# Patient Record
Sex: Male | Born: 1965 | Race: White | Hispanic: No | Marital: Married
Health system: Midwestern US, Community
[De-identification: ages and names within clinical notes are randomized; demographics above are authoritative.]

## PROBLEM LIST (undated history)

## (undated) DIAGNOSIS — F32A Depression, unspecified: Secondary | ICD-10-CM

## (undated) DIAGNOSIS — I10 Essential (primary) hypertension: Secondary | ICD-10-CM

## (undated) DIAGNOSIS — F329 Major depressive disorder, single episode, unspecified: Secondary | ICD-10-CM

## (undated) DIAGNOSIS — F419 Anxiety disorder, unspecified: Secondary | ICD-10-CM

## (undated) HISTORY — PX: SPINAL CORD STIMULATOR IMPLANT: SHX2422

## (undated) HISTORY — PX: HAND SURGERY: SHX662

## (undated) HISTORY — PX: CHOLECYSTECTOMY: SHX55

## (undated) HISTORY — PX: BACK SURGERY: SHX140

---

## 1998-07-12 ENCOUNTER — Ambulatory Visit (HOSPITAL_COMMUNITY): Admission: RE | Admit: 1998-07-12 | Discharge: 1998-07-12 | Payer: Self-pay | Admitting: *Deleted

## 2000-10-26 ENCOUNTER — Encounter: Payer: Self-pay | Admitting: Orthopedic Surgery

## 2000-10-26 ENCOUNTER — Observation Stay (HOSPITAL_COMMUNITY): Admission: EM | Admit: 2000-10-26 | Discharge: 2000-10-26 | Payer: Self-pay | Admitting: Emergency Medicine

## 2000-10-26 ENCOUNTER — Encounter (INDEPENDENT_AMBULATORY_CARE_PROVIDER_SITE_OTHER): Payer: Self-pay | Admitting: *Deleted

## 2003-03-15 ENCOUNTER — Ambulatory Visit (HOSPITAL_COMMUNITY): Admission: RE | Admit: 2003-03-15 | Discharge: 2003-03-15 | Payer: Self-pay | Admitting: General Surgery

## 2005-02-26 ENCOUNTER — Emergency Department (HOSPITAL_COMMUNITY): Admission: EM | Admit: 2005-02-26 | Discharge: 2005-02-26 | Payer: Self-pay | Admitting: Emergency Medicine

## 2005-03-12 ENCOUNTER — Encounter: Payer: Self-pay | Admitting: Family Medicine

## 2005-03-17 ENCOUNTER — Encounter (INDEPENDENT_AMBULATORY_CARE_PROVIDER_SITE_OTHER): Payer: Self-pay | Admitting: *Deleted

## 2005-03-17 ENCOUNTER — Ambulatory Visit (HOSPITAL_COMMUNITY): Admission: RE | Admit: 2005-03-17 | Discharge: 2005-03-17 | Payer: Self-pay | Admitting: Interventional Radiology

## 2005-03-30 ENCOUNTER — Encounter: Payer: Self-pay | Admitting: Interventional Radiology

## 2006-09-23 ENCOUNTER — Ambulatory Visit: Payer: Self-pay | Admitting: Cardiology
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
## 2009-08-28 ENCOUNTER — Encounter: Admission: RE | Admit: 2009-08-28 | Discharge: 2009-08-28 | Payer: Self-pay | Admitting: Sports Medicine

## 2010-10-24 DIAGNOSIS — R55 Syncope and collapse: Secondary | ICD-10-CM
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
## 2011-01-09 NOTE — H&P (Signed)
   NAME:  Aaron Soto, Aaron Soto NO.:  [id number]   MEDICAL RECORD NO.:  [id number]                  PATIENT TYPE:   LOCATION:                                       FACILITY:   PHYSICIAN:  Dalia Heading, M.D.               DATE OF BIRTH:  1965/12/10   DATE OF ADMISSION:  03/15/2003  DATE OF DISCHARGE:                                HISTORY & PHYSICAL   CHIEF COMPLAINT:  Peptic ulcer disease, GERD, hematochezia.   HISTORY OF PRESENT ILLNESS:  The patient is a 45 year old white male who is  referred for endoscopic evaluation.  He has been having intermittent  hematochezia of unknown etiology for over a month.  He denies any  lightheadedness, weight loss, fever, constipation, diarrhea, or melena.  He  denies hemorrhoidal problems.  He has never had a colonoscopy.  He also has  a history of peptic ulcer disease and GERD.  He takes ibuprofen on occasion.  He had an EGD in the past which revealed peptic ulcer disease.  No immediate  family history of colon carcinoma is noted.   PAST MEDICAL HISTORY:  As noted above; depression.   PAST SURGICAL HISTORY:  Right index finger surgery.   CURRENT MEDICATIONS:  Prilosec, BuSpar, fluoxetine.   ALLERGIES:  No known drug allergies.   REVIEW OF SYSTEMS:  Noncontributory.   PHYSICAL EXAMINATION:  GENERAL:  The patient is a well-developed, well-  nourished white male in no acute distress.  VITAL SIGNS:  He is afebrile and vital signs are stable.  LUNGS:  Clear to auscultation with equal breath sounds bilaterally.  HEART:  Reveals a regular rate and rhythm without S3, S4, or murmurs.  ABDOMEN:  Soft, nontender, nondistended.  No hepatosplenomegaly or masses  are noted.  RECTAL:  Deferred to the procedure.   IMPRESSION:  Peptic ulcer disease, gastroesophageal reflux disease,  hematochezia.    PLAN:  The patient is scheduled for an EGD and colonoscopy on March 15, 2003.  The risks and benefits of the procedure  including bleeding and perforation  were fully explained to the patient, who gave informed consent.                                               Dalia Heading, M.D.    MAJ/MEDQ  D:  03/01/2003  T:  03/01/2003  Job:  [id number]   cc:   Donzetta Sprung  [address]  [place]  [place] [address]  Fax: [phone]

## 2011-01-09 NOTE — Consult Note (Signed)
NAMEGARRETH, BURNSWORTH                 ACCOUNT NO.:  [id number]   MEDICAL RECORD NO.:  [id number]          PATIENT TYPE:  OUT   LOCATION:  XRAY                         FACILITY:  [facility]   PHYSICIAN:  Sanjeev K. Deveshwar, M.D.DATE OF BIRTH:  1966-03-22   DATE OF CONSULTATION:  03/12/2005  DATE OF DISCHARGE:                                   CONSULTATION   ADDENDUM:  The patient requested a prescription for some pain medication.  We did give him oxycodone 5/325 mg, #30, one q.6h. p.r.n. pain.  It should  be noted that greater than 40 minutes was spent on this consultation today.      Markus.Osmond   DR/MEDQ  D:  03/12/2005  T:  03/12/2005  Job:  [id number]

## 2011-01-09 NOTE — Op Note (Signed)
[place] [place] [facility]  Patient:    Aaron Soto, Aaron Soto                        MRN: [id number] Proc. Date: 10/26/00 Adm. Date:  [id number] Disc. 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  OPERATION PERFORMED: 1. Excision of residual nail matrix, right index finger. 2. Revision amputation and V-Y advancement flap coverage of proximal epiphysis    of right index distal phalanx.  SURGEON:  Katy Fitch. Sypher, Montez Hageman., M.D.  ASSISTANT:  Jonni Sanger, P.A.  ANESTHESIA:  0.25% Marcaine and 1% lidocaine metacarpal head level block of right index finger supplemented by IV sedation.  SUPERVISING ANESTHESIOLOGIST:  Dr. Krista Blue.  INDICATIONS FOR PROCEDURE:  The patient is a 45 year old right-handed man who sustained an avulsion amputation of his right index fingertip when he accidentally hung his shoe and hand on a chain link fence he was jumping over to retrieve a socker ball while watching his daughters soccer scrimmage.  He did not realize that his finger was avulsed until he looked down to get the ball to throw back and saw his bloody finger stump.  He was seen at [facility] where the emergency room physician determined that hand consult was indicated. 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  After informed consent, he is brought to the operating room at this time for revision of his right index finger and formal nail resection.  Preoperatively, we advised him  that we would try to preserve the base of the distal phalanx to preserve the insertion of his distal extensor tendon as well as his profundus tendon.  He understood that we may need to perform a V-Y advancement flap to provide coverage of the distal phalanx.  He understands from past experience with a V-Y advancement flap of the long finger that he may have altered sensibility and impaired tactile sense in the fingertip for a period of timme following flap coverage.  DESCRIPTION OF PROCEDURE:  Eryck Negron was brought to the operating room and placed in supine position on the operating table.  Following placement of a Marcaine and lidocaine metacarpal head level block, the right arm was prepped with Betadine soap and solution and sterilely draped.  When anesthesia was satisfactory, the finger was exsanguinated with a gauze wrap and a half-inch Penrose drain was placed at the base of the finger as a digital tourniquet.  The wound was meticulously debrided with sharp excision of the wound margins and debridement of the soiled distal phalangeal proximal metaphysis.  The remaining proximal epiphysis was rounded to a flat dome shape.  Care was taken to preserve the profundus insertion, the distal extensor slip insertion and the collateral ligament insertions.  The wound was then irrigated and closed by a V-Y advancement flap.  It was advanced approximately 8 mm to cover the exposed distal phalanx.  Care was taken to preserve the vascular and  neural connections to the flap.  The flap was inset with multiple interrupted sutures of 5-0 nylon.  Prior to completion of inset of the flap, remnants of the nail matrix were removed from the periosteum of the distal phalanx and the dorsal nail fold.  Care was taken to fully debride all visible nail matrix.  The wound was then irrigated a final time and closed dorsally with mattress sutures of 5-0 nylon.  Satisfactory full thickness skin coverage was  obtained with subcutaneous fat padding the distal phalanx.  There were no apparent complications.  The tourniquet was released with immediate capillary refill to the fingertip and good blush in flap.  Mr. Spinello was placed in a compressive dressing with Xeroflo, sterile gauze and Coban.  He was instructed to elevate his hand for 72 hours, 24/7 followed by return to our office for dressing change in five days.  He was given prescriptions for Keflex 500 mg 1 p.o. q.8h. x 4 days as prophylactic antibiotic, Percocet 5 mg 1 or 2 p.o. q.4-6h. p.r.n. 30 tablets without refill and Motrin 600 mg 1 p.o. q.6h. p.r.n. pain, 20 tablets with one refill. DD:  10/26/00 TD:  10/27/00 Job: [id number]

## 2015-10-17 ENCOUNTER — Other Ambulatory Visit: Payer: Self-pay | Admitting: Rheumatology

## 2015-10-17 DIAGNOSIS — M7989 Other specified soft tissue disorders: Secondary | ICD-10-CM

## 2015-10-17 DIAGNOSIS — M79642 Pain in left hand: Secondary | ICD-10-CM

## 2015-10-21 ENCOUNTER — Other Ambulatory Visit: Payer: Self-pay

## 2015-10-23 ENCOUNTER — Ambulatory Visit
Admission: RE | Admit: 2015-10-23 | Discharge: 2015-10-23 | Disposition: A | Discharge status: Home / Self Care | Payer: BLUE CROSS/BLUE SHIELD | Source: Ambulatory Visit | Attending: Rheumatology | Admitting: Rheumatology

## 2015-10-23 DIAGNOSIS — M7989 Other specified soft tissue disorders: Secondary | ICD-10-CM

## 2015-10-23 DIAGNOSIS — M79642 Pain in left hand: Secondary | ICD-10-CM

## 2016-10-04 ENCOUNTER — Other Ambulatory Visit: Payer: Self-pay

## 2016-10-04 ENCOUNTER — Emergency Department (HOSPITAL_COMMUNITY)
Admission: EM | Admit: 2016-10-04 | Discharge: 2016-10-04 | Disposition: A | Discharge status: Home / Self Care | Payer: BLUE CROSS/BLUE SHIELD | Attending: Emergency Medicine | Admitting: Emergency Medicine

## 2016-10-04 ENCOUNTER — Encounter (HOSPITAL_COMMUNITY): Payer: Self-pay | Admitting: Emergency Medicine

## 2016-10-04 DIAGNOSIS — R55 Syncope and collapse: Secondary | ICD-10-CM

## 2016-10-04 HISTORY — DX: Anxiety disorder, unspecified: F41.9

## 2016-10-04 HISTORY — DX: Essential (primary) hypertension: I10

## 2016-10-04 HISTORY — DX: Depression, unspecified: F32.A

## 2016-10-04 HISTORY — DX: Major depressive disorder, single episode, unspecified: F32.9

## 2016-10-04 LAB — CBG MONITORING, ED: GLUCOSE-CAPILLARY: 114 mg/dL — AB (ref 65–99)

## 2016-10-04 LAB — I-STAT CHEM 8, ED
BUN: 18 mg/dL (ref 6–20)
Calcium, Ion: 1.05 mmol/L — ABNORMAL LOW (ref 1.15–1.40)
Chloride: 103 mmol/L (ref 101–111)
Creatinine, Ser: 1.2 mg/dL (ref 0.61–1.24)
GLUCOSE: 119 mg/dL — AB (ref 65–99)
HEMATOCRIT: 45 % (ref 39.0–52.0)
HEMOGLOBIN: 15.3 g/dL (ref 13.0–17.0)
POTASSIUM: 3.5 mmol/L (ref 3.5–5.1)
Sodium: 136 mmol/L (ref 135–145)
TCO2: 23 mmol/L (ref 0–100)

## 2016-10-04 MED ORDER — SODIUM CHLORIDE 0.9 % IV BOLUS (SEPSIS)
1000.0000 mL | Freq: Once | INTRAVENOUS | Status: AC
  Administered 2016-10-04: 1000 mL via INTRAVENOUS

## 2016-10-04 NOTE — ED Notes (Signed)
Pt given sprite to drink. 
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
## 2016-10-04 NOTE — ED Notes (Signed)
MD at bedside. 
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
## 2016-10-04 NOTE — Discharge Instructions (Signed)
If you feel lightheaded or get sweaty, lie on the ground with your legs in the air to prevent passing out or falling Return for worsening symptoms

## 2018-12-03 ENCOUNTER — Inpatient Hospital Stay (HOSPITAL_COMMUNITY)
Admission: EM | Admit: 2018-12-03 | Discharge: 2018-12-06 | DRG: 066 | Disposition: A | Discharge status: Home / Self Care | Payer: Self-pay | Attending: Neurosurgery | Admitting: Neurosurgery

## 2018-12-03 ENCOUNTER — Emergency Department (HOSPITAL_COMMUNITY): Payer: Self-pay

## 2018-12-03 ENCOUNTER — Other Ambulatory Visit: Payer: Self-pay

## 2018-12-03 ENCOUNTER — Encounter (HOSPITAL_COMMUNITY): Payer: Self-pay | Admitting: Emergency Medicine

## 2018-12-03 DIAGNOSIS — R29701 NIHSS score 1: Secondary | ICD-10-CM | POA: Diagnosis present

## 2018-12-03 DIAGNOSIS — I609 Nontraumatic subarachnoid hemorrhage, unspecified: Principal | ICD-10-CM | POA: Diagnosis present

## 2018-12-03 DIAGNOSIS — Z79899 Other long term (current) drug therapy: Secondary | ICD-10-CM

## 2018-12-03 DIAGNOSIS — I10 Essential (primary) hypertension: Secondary | ICD-10-CM | POA: Diagnosis present

## 2018-12-03 DIAGNOSIS — R402242 Coma scale, best verbal response, confused conversation, at arrival to emergency department: Secondary | ICD-10-CM | POA: Diagnosis present

## 2018-12-03 DIAGNOSIS — R402142 Coma scale, eyes open, spontaneous, at arrival to emergency department: Secondary | ICD-10-CM | POA: Diagnosis present

## 2018-12-03 DIAGNOSIS — Z791 Long term (current) use of non-steroidal anti-inflammatories (NSAID): Secondary | ICD-10-CM

## 2018-12-03 DIAGNOSIS — G8929 Other chronic pain: Secondary | ICD-10-CM | POA: Diagnosis present

## 2018-12-03 DIAGNOSIS — Z9049 Acquired absence of other specified parts of digestive tract: Secondary | ICD-10-CM

## 2018-12-03 DIAGNOSIS — Q278 Other specified congenital malformations of peripheral vascular system: Secondary | ICD-10-CM

## 2018-12-03 DIAGNOSIS — R402362 Coma scale, best motor response, obeys commands, at arrival to emergency department: Secondary | ICD-10-CM | POA: Diagnosis present

## 2018-12-03 DIAGNOSIS — M25519 Pain in unspecified shoulder: Secondary | ICD-10-CM

## 2018-12-03 DIAGNOSIS — F419 Anxiety disorder, unspecified: Secondary | ICD-10-CM | POA: Diagnosis present

## 2018-12-03 DIAGNOSIS — Z87891 Personal history of nicotine dependence: Secondary | ICD-10-CM

## 2018-12-03 DIAGNOSIS — Z9682 Presence of neurostimulator: Secondary | ICD-10-CM

## 2018-12-03 DIAGNOSIS — I639 Cerebral infarction, unspecified: Secondary | ICD-10-CM

## 2018-12-03 DIAGNOSIS — M549 Dorsalgia, unspecified: Secondary | ICD-10-CM | POA: Diagnosis present

## 2018-12-03 DIAGNOSIS — F329 Major depressive disorder, single episode, unspecified: Secondary | ICD-10-CM | POA: Diagnosis present

## 2018-12-03 LAB — COMPREHENSIVE METABOLIC PANEL
ALT: 17 U/L (ref 0–44)
AST: 20 U/L (ref 15–41)
Albumin: 4.4 g/dL (ref 3.5–5.0)
Alkaline Phosphatase: 62 U/L (ref 38–126)
Anion gap: 9 (ref 5–15)
BUN: 19 mg/dL (ref 6–20)
CO2: 27 mmol/L (ref 22–32)
Calcium: 8.9 mg/dL (ref 8.9–10.3)
Chloride: 103 mmol/L (ref 98–111)
Creatinine, Ser: 1.33 mg/dL — ABNORMAL HIGH (ref 0.61–1.24)
GFR calc Af Amer: >60 mL/min (ref >60)
GFR calc non Af Amer: 60 mL/min — ABNORMAL LOW (ref >60)
Glucose, Bld: 100 mg/dL — ABNORMAL HIGH (ref 70–99)
Potassium: 4.3 mmol/L (ref 3.5–5.1)
Sodium: 139 mmol/L (ref 135–145)
Total Bilirubin: 0.6 mg/dL (ref 0.3–1.2)
Total Protein: 7.8 g/dL (ref 6.5–8.1)

## 2018-12-03 LAB — CBC WITH DIFFERENTIAL/PLATELET
Abs Immature Granulocytes: 0.05 10*3/uL (ref 0.00–0.07)
Basophils Absolute: 0 10*3/uL (ref 0.0–0.1)
Basophils Relative: 1 %
Eosinophils Absolute: 0.2 10*3/uL (ref 0.0–0.5)
Eosinophils Relative: 3 %
HCT: 48.1 % (ref 39.0–52.0)
Hemoglobin: 15.6 g/dL (ref 13.0–17.0)
Immature Granulocytes: 1 %
Lymphocytes Relative: 31 %
Lymphs Abs: 2.6 10*3/uL (ref 0.7–4.0)
MCH: 28 pg (ref 26.0–34.0)
MCHC: 32.4 g/dL (ref 30.0–36.0)
MCV: 86.4 fL (ref 80.0–100.0)
Monocytes Absolute: 0.6 10*3/uL (ref 0.1–1.0)
Monocytes Relative: 8 %
Neutro Abs: 4.9 10*3/uL (ref 1.7–7.7)
Neutrophils Relative %: 56 %
Platelets: 242 10*3/uL (ref 150–400)
RBC: 5.57 MIL/uL (ref 4.22–5.81)
RDW: 12.6 % (ref 11.5–15.5)
WBC: 8.5 10*3/uL (ref 4.0–10.5)
nRBC: 0 % (ref 0.0–0.2)

## 2018-12-03 LAB — URINALYSIS, ROUTINE W REFLEX MICROSCOPIC
Bilirubin Urine: NEGATIVE
Glucose, UA: NEGATIVE mg/dL
Hgb urine dipstick: NEGATIVE
Ketones, ur: NEGATIVE mg/dL
Leukocytes,Ua: NEGATIVE
Nitrite: NEGATIVE
Protein, ur: NEGATIVE mg/dL
Specific Gravity, Urine: 1.019 (ref 1.005–1.030)
pH: 5 (ref 5.0–8.0)

## 2018-12-03 LAB — TROPONIN I: Troponin I: <0.03 ng/mL (ref <0.03)

## 2018-12-03 LAB — RAPID URINE DRUG SCREEN, HOSP PERFORMED
Amphetamines: NOT DETECTED
Barbiturates: NOT DETECTED
Benzodiazepines: NOT DETECTED
Cocaine: NOT DETECTED
Opiates: POSITIVE — AB
Tetrahydrocannabinol: NOT DETECTED

## 2018-12-03 LAB — MRSA PCR SCREENING: MRSA by PCR: NEGATIVE

## 2018-12-03 LAB — CBG MONITORING, ED: Glucose-Capillary: 85 mg/dL (ref 70–99)

## 2018-12-03 LAB — ETHANOL: Alcohol, Ethyl (B): <10 mg/dL (ref <10)

## 2018-12-03 MED ORDER — PANTOPRAZOLE SODIUM 40 MG PO TBEC: 40.0000 mg | DELAYED_RELEASE_TABLET | Freq: Every day | ORAL | Status: DC

## 2018-12-03 MED ORDER — MORPHINE SULFATE (PF) 4 MG/ML IV SOLN
4.0000 mg | Freq: Once | INTRAVENOUS | Status: AC
  Administered 2018-12-03: 4 mg via INTRAVENOUS

## 2018-12-03 MED ORDER — ACETAMINOPHEN-CODEINE #3 300-30 MG PO TABS
1.0000 | ORAL_TABLET | ORAL | Status: DC | PRN
  Administered 2018-12-06: 1 via ORAL
  Filled 2018-12-03: qty 2

## 2018-12-03 MED ORDER — MORPHINE SULFATE (PF) 4 MG/ML IV SOLN
INTRAVENOUS | Status: AC
  Filled 2018-12-03: qty 1

## 2018-12-03 MED ORDER — SODIUM CHLORIDE 0.9 % IV SOLN
INTRAVENOUS | Status: DC
  Administered 2018-12-03 – 2018-12-06 (×5): via INTRAVENOUS

## 2018-12-03 MED ORDER — CYCLOBENZAPRINE HCL 10 MG PO TABS
10.0000 mg | ORAL_TABLET | Freq: Every day | ORAL | Status: DC
  Administered 2018-12-04 – 2018-12-06 (×3): 10 mg via ORAL
  Filled 2018-12-03 (×3): qty 1

## 2018-12-03 MED ORDER — MORPHINE SULFATE (PF) 2 MG/ML IV SOLN
1.0000 mg | INTRAVENOUS | Status: DC | PRN
  Administered 2018-12-03: 16:00:00 2 mg via INTRAVENOUS
  Filled 2018-12-03: qty 1

## 2018-12-03 MED ORDER — OXYCODONE HCL 5 MG PO TABS: 5.0000 mg | ORAL_TABLET | ORAL | Status: DC | PRN

## 2018-12-03 MED ORDER — DOCUSATE SODIUM 100 MG PO CAPS
100.0000 mg | ORAL_CAPSULE | Freq: Two times a day (BID) | ORAL | Status: DC
  Administered 2018-12-03 – 2018-12-06 (×6): 100 mg via ORAL
  Filled 2018-12-03 (×6): qty 1

## 2018-12-03 MED ORDER — CLEVIDIPINE BUTYRATE 0.5 MG/ML IV EMUL
0.0000 mg/h | INTRAVENOUS | Status: DC
  Administered 2018-12-03: 16:00:00 1 mg/h via INTRAVENOUS
  Filled 2018-12-03 (×2): qty 50

## 2018-12-03 MED ORDER — ONDANSETRON HCL 4 MG/2ML IJ SOLN: 4.0000 mg | Freq: Four times a day (QID) | INTRAMUSCULAR | Status: DC | PRN

## 2018-12-03 MED ORDER — SODIUM CHLORIDE 0.9 % IV SOLN
INTRAVENOUS | Status: DC
  Administered 2018-12-03: 13:00:00 via INTRAVENOUS

## 2018-12-03 MED ORDER — CYANOCOBALAMIN 500 MCG PO TABS
500.0000 ug | ORAL_TABLET | Freq: Every day | ORAL | Status: DC
  Administered 2018-12-03 – 2018-12-06 (×3): 500 ug via ORAL
  Filled 2018-12-03 (×4): qty 1

## 2018-12-03 MED ORDER — MORPHINE SULFATE (PF) 4 MG/ML IV SOLN: 4.0000 mg | Freq: Once | INTRAVENOUS | Status: DC

## 2018-12-03 MED ORDER — LABETALOL HCL 5 MG/ML IV SOLN
20.0000 mg | Freq: Once | INTRAVENOUS | Status: DC
  Filled 2018-12-03: qty 4

## 2018-12-03 MED ORDER — STROKE: EARLY STAGES OF RECOVERY BOOK
Freq: Once | Status: AC
  Administered 2018-12-03: 19:00:00

## 2018-12-03 MED ORDER — PANTOPRAZOLE SODIUM 40 MG PO PACK
40.0000 mg | PACK | Freq: Every day | ORAL | Status: DC
  Filled 2018-12-03 (×4): qty 20

## 2018-12-03 MED ORDER — ACETAMINOPHEN 160 MG/5ML PO SOLN: 650.0000 mg | ORAL | Status: DC | PRN

## 2018-12-03 MED ORDER — IOHEXOL 350 MG/ML SOLN
75.0000 mL | Freq: Once | INTRAVENOUS | Status: AC | PRN
  Administered 2018-12-03: 75 mL via INTRAVENOUS

## 2018-12-03 MED ORDER — BUSPIRONE HCL 10 MG PO TABS
20.0000 mg | ORAL_TABLET | Freq: Three times a day (TID) | ORAL | Status: DC
  Administered 2018-12-03 – 2018-12-06 (×8): 20 mg via ORAL
  Filled 2018-12-03 (×9): qty 2

## 2018-12-03 MED ORDER — ONDANSETRON 4 MG PO TBDP: 4.0000 mg | ORAL_TABLET | Freq: Four times a day (QID) | ORAL | Status: DC | PRN

## 2018-12-03 MED ORDER — ONDANSETRON HCL 4 MG/2ML IJ SOLN
4.0000 mg | Freq: Once | INTRAMUSCULAR | Status: AC
  Administered 2018-12-03: 14:00:00 4 mg via INTRAVENOUS

## 2018-12-03 MED ORDER — CLEVIDIPINE BUTYRATE 0.5 MG/ML IV EMUL: 0.0000 mg/h | INTRAVENOUS | Status: DC

## 2018-12-03 MED ORDER — HYDROCODONE-ACETAMINOPHEN 5-325 MG PO TABS
1.0000 | ORAL_TABLET | ORAL | Status: DC | PRN
  Administered 2018-12-03 (×2): 1 via ORAL
  Administered 2018-12-04 (×3): 2 via ORAL
  Administered 2018-12-05 (×2): 1 via ORAL
  Administered 2018-12-06 (×2): 2 via ORAL
  Filled 2018-12-03 (×2): qty 1
  Filled 2018-12-03 (×2): qty 2
  Filled 2018-12-03 (×2): qty 1
  Filled 2018-12-03 (×3): qty 2

## 2018-12-03 MED ORDER — ACETAMINOPHEN 650 MG RE SUPP: 650.0000 mg | RECTAL | Status: DC | PRN

## 2018-12-03 MED ORDER — ONDANSETRON HCL 4 MG/2ML IJ SOLN
INTRAMUSCULAR | Status: AC
  Administered 2018-12-03: 4 mg via INTRAVENOUS
  Filled 2018-12-03: qty 2

## 2018-12-03 MED ORDER — LEVETIRACETAM IN NACL 500 MG/100ML IV SOLN
500.0000 mg | Freq: Two times a day (BID) | INTRAVENOUS | Status: DC
  Administered 2018-12-03 – 2018-12-06 (×6): 500 mg via INTRAVENOUS
  Filled 2018-12-03 (×10): qty 100

## 2018-12-03 MED ORDER — ACETAMINOPHEN 325 MG PO TABS
650.0000 mg | ORAL_TABLET | ORAL | Status: DC | PRN
  Administered 2018-12-05 – 2018-12-06 (×3): 650 mg via ORAL
  Filled 2018-12-03 (×3): qty 2

## 2018-12-03 MED ORDER — DULOXETINE HCL 60 MG PO CPEP
60.0000 mg | ORAL_CAPSULE | Freq: Every day | ORAL | Status: DC
  Administered 2018-12-03 – 2018-12-06 (×4): 60 mg via ORAL
  Filled 2018-12-03 (×4): qty 1

## 2018-12-03 MED ORDER — PANTOPRAZOLE SODIUM 40 MG PO TBEC
40.0000 mg | DELAYED_RELEASE_TABLET | Freq: Every day | ORAL | Status: DC
  Administered 2018-12-03 – 2018-12-06 (×4): 40 mg via ORAL
  Filled 2018-12-03 (×4): qty 1

## 2018-12-03 MED ORDER — LISINOPRIL 10 MG PO TABS
10.0000 mg | ORAL_TABLET | Freq: Every day | ORAL | Status: DC
  Administered 2018-12-03 – 2018-12-06 (×4): 10 mg via ORAL
  Filled 2018-12-03 (×4): qty 1

## 2018-12-03 NOTE — ED Triage Notes (Signed)
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  Unable to answer questions. EDP at bedside  Pt is mildly confused at this time. Central chest pain at this time. VSS

## 2018-12-03 NOTE — ED Notes (Signed)
Pt unable to provide urine sample at this time 
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
## 2018-12-03 NOTE — ED Notes (Signed)
Labetalol not given per Dr. Lynelle Doctor due to Cleviprex order (written by Dr. Lynelle Doctor at [facility]) already started at 1609.

## 2018-12-03 NOTE — ED Notes (Signed)
Pt was at work and a customer went to talk to him and they realized he was confused and staring off into space. The customer went to his boss and reported it. Pt's boss brought him into the ED. Pt is alert to self and place, disoriented to time and situation. When pt is asked what year it is he replies, "2020", but when asked what month he replies, "August".   Pt continues to repeat himself, over and over, saying "What happened? Why am I here?". Pt reassured what is going on and then about 10 seconds later, pt repeats the same question with confusion. Pt is normally alert and oriented x4. Pt's LKW was at 1130 while at work per his boss.

## 2018-12-03 NOTE — Progress Notes (Addendum)
  NEUROSURGERY PROGRESS NOTE   Received call from Dr Lynelle Doctor regarding patient.  53 year old male who was found at work confused by a Financial trader. By report is confused with repetitive questioning.   CT head shows a small amount of hyperdense area within the right parietal lobe, likely acute SAH. CTA shows ?AVM vs venous angioma.   Will need to undergo diagnostic angiogram for further evaluation. Will admit to Neuro ICU for monitoring. Full note to follow.  Please call when patient arrives to [facility].   Cindra Presume, PA-C [facility]

## 2018-12-03 NOTE — ED Provider Notes (Signed)
[facility] EMERGENCY DEPARTMENT Provider Note   CSN: [id number] Arrival date & time: 12/03/18  1231    History   Chief Complaint Chief Complaint  Patient presents with  . Altered Mental Status    HPI Aaron Soto is a 53 y.o. male.     HPI The patient presents to the emergency room for evaluation of altered mental status.  The patient was at work today.  He was apparently helping a customer with some pine needles when the customer noticed that the patient was very confused.  They got the patient's manager who then brought him to the emergency room.  There is no witnessed trauma.  According to the patient's wife was doing fine before he went to work today.  Patient himself does not know what happened.  He does not know why he is here.  He keeps asking the same question about where his wife is what happened.  He does complain of some pain in the back of his head.  He also complains of some pain in the anterior part of his chest.  Pain is sharp.  He denies any fevers or chills.  No vomiting or diarrhea.  No numbness or weakness. Past Medical History:  Diagnosis Date  . Anxiety   . Depression   . Hypertension     There are no active problems to display for this patient.   Past Surgical History:  Procedure Laterality Date  . BACK SURGERY    . CHOLECYSTECTOMY    . HAND SURGERY    . SPINAL CORD STIMULATOR IMPLANT          Home Medications    Prior to Admission medications   Medication Sig Start Date End Date Taking? Authorizing Provider  busPIRone (BUSPAR) 10 MG tablet Take 20 mg by mouth 3 (three) times daily.    Yes [provider]  cyclobenzaprine (FLEXERIL) 10 MG tablet Take 10 mg by mouth daily.    Yes [provider]  DULoxetine (CYMBALTA) 60 MG capsule Take 60 mg by mouth daily.    Yes [provider]  ibuprofen (ADVIL,MOTRIN) 200 MG tablet Take 200 mg by mouth every 6 (six) hours as needed for mild pain.   Yes [provider]   ketoprofen (ORUDIS) 75 MG capsule Take 75 mg by mouth every 8 (eight) hours as needed for mild pain.    Yes [provider]  lisinopril (PRINIVIL,ZESTRIL) 10 MG tablet Take 10 mg by mouth daily.    Yes [provider]  Multiple Vitamin (MULTIVITAMIN) tablet Take 1 tablet by mouth daily.   Yes [provider]  omeprazole (PRILOSEC) 20 MG capsule Take 20 mg by mouth daily.   Yes [provider]  OVER THE COUNTER MEDICATION Take by mouth See admin instructions. CBD Oil - 750 mg by mouth twice daily   Yes [provider]  vitamin B-12 (CYANOCOBALAMIN) 500 MCG tablet Take 500 mcg by mouth daily.   Yes [provider]    Family History History reviewed. No pertinent family history.  Social History Social History   Tobacco Use  . Smoking status: Former Smoker    Last attempt to quit: 08/24/1986    Years since quitting: 32.2  . Smokeless tobacco: Never Used  Substance Use Topics  . Alcohol use: No  . Drug use: No     Allergies   Patient has no allergy information on record.   Review of Systems Review of Systems  All other systems  reviewed and are negative.    Physical Exam Updated Vital Signs BP (!) 143/92   Pulse 92   Resp 15   SpO2 99%   Physical Exam Vitals signs and nursing note reviewed.  Constitutional:      General: He is not in acute distress.    Appearance: He is well-developed.  HENT:     Head: Normocephalic and atraumatic.     Right Ear: External ear normal.     Left Ear: External ear normal.  Eyes:     General: No scleral icterus.       Right eye: No discharge.        Left eye: No discharge.     Conjunctiva/sclera: Conjunctivae normal.  Neck:     Musculoskeletal: Neck supple.     Trachea: No tracheal deviation.  Cardiovascular:     Rate and Rhythm: Normal rate and regular rhythm.  Pulmonary:     Effort: Pulmonary effort is normal. No respiratory distress.     Breath sounds: Normal breath sounds. No  stridor. No wheezing or rales.  Abdominal:     General: Bowel sounds are normal. There is no distension.     Palpations: Abdomen is soft.     Tenderness: There is no abdominal tenderness. There is no guarding or rebound.  Musculoskeletal:        General: No tenderness.  Skin:    General: Skin is warm and dry.     Findings: No rash.  Neurological:     Mental Status: He is alert.     GCS: GCS eye subscore is 4. GCS verbal subscore is 4. GCS motor subscore is 6.     Cranial Nerves: No cranial nerve deficit (no facial droop, extraocular movements intact, no slurred speech).     Sensory: No sensory deficit.     Motor: No abnormal muscle tone or seizure activity.     Coordination: Coordination normal.     Comments: Patient knows the year is 2020, he thinks the it is August, he is able to tell me where he lives, he does not know who the president is      ED Treatments / Results  Labs (all labs ordered are listed, but only abnormal results are displayed) Labs Reviewed  COMPREHENSIVE METABOLIC PANEL - Abnormal; Notable for the following components:      Result Value   Glucose, Bld 100 (*)    Creatinine, Ser 1.33 (*)    GFR calc non Af Amer 60 (*)    All other components within normal limits  CBC WITH DIFFERENTIAL/PLATELET  ETHANOL  TROPONIN I  URINALYSIS, ROUTINE W REFLEX MICROSCOPIC  RAPID URINE DRUG SCREEN, HOSP PERFORMED  CBG MONITORING, ED  CBG MONITORING, ED    EKG EKG Interpretation  Date/Time:  Saturday December 03 2018 12:38:22 EDT Ventricular Rate:  97 PR Interval:    QRS Duration: 91 QT Interval:  344 QTC Calculation: 437 R Axis:   69 Text Interpretation:  Sinus rhythm Low voltage, precordial leads No significant change since last tracing Confirmed by Linwood Dibbles [phone]) on 12/03/2018 12:52:25 PM   Radiology Ct Head Wo Contrast  Result Date: 12/03/2018 CLINICAL DATA:  Confusion, chest pain. EXAM: CT HEAD WITHOUT CONTRAST TECHNIQUE: Contiguous axial images were  obtained from the base of the skull through the vertex without intravenous contrast. COMPARISON:  None. FINDINGS: Brain: Small hyperdense focus adjacent to the posterior margin of the RIGHT sylvian fissure, most likely small focus of acute subarachnoid hemorrhage within  the sulcus, alternatively chronic calcification. There is no mass, hemorrhage, edema or other evidence of acute parenchymal abnormality. No mass effect or midline shift. Vascular: No hyperdense vessel or unexpected calcification. Skull: Normal. Negative for fracture or focal lesion. Sinuses/Orbits: No acute finding. Other: None. IMPRESSION: 1. Small hyperdense focus within the RIGHT parietal lobe, most likely small acute subarachnoid hemorrhage, possibly sequela of venous angioma or other vascular malformation in the absence of trauma. As patient has a spinal cord stimulator in place, recommend CT angiogram of the head for further characterization. 2. Remainder of the exam is unremarkable, as detailed above. Critical Value/emergent results were called by telephone at the time of interpretation on 12/03/2018 at 2:20 pm to Dr. Linwood DibblesJON Prisila Dlouhy , who verbally acknowledged these results. Electronically Signed   By: Bary RichardStan  Maynard M.D.   On: 12/03/2018 14:22   Dg Chest Port 1 View  Result Date: 12/03/2018 CLINICAL DATA:  Altered level of consciousness EXAM: PORTABLE CHEST 1 VIEW COMPARISON:  Chest x-ray dated 03/17/2010. FINDINGS: Heart size and mediastinal contours are within normal limits given the supine patient positioning. Lungs are clear. No pleural effusion or pneumothorax seen. Stimulator device positioned within the midline of the lower thoracic spine. Additional catheter extending from the neck to the RIGHT upper abdomen, of uncertain etiology. IMPRESSION: No active disease.  No evidence of pneumonia or pulmonary edema. Electronically Signed   By: Bary RichardStan  Maynard M.D.   On: 12/03/2018 14:12    Procedures .Critical Care Performed by: Linwood DibblesKnapp, Lark Langenfeld, MD  Authorized by: Linwood DibblesKnapp, Elizebath Wever, MD   Critical care provider statement:    Critical care time (minutes):  30   Critical care was time spent personally by me on the following activities:  Discussions with consultants, evaluation of patient's response to treatment, examination of patient, ordering and performing treatments and interventions, ordering and review of laboratory studies, ordering and review of radiographic studies, pulse oximetry, re-evaluation of patient's condition, obtaining history from patient or surrogate and review of old charts   (including critical care time)  Medications Ordered in ED Medications  0.9 %  sodium chloride infusion ( Intravenous New Bag/Given 12/03/18 1258)  morphine 4 MG/ML injection (has no administration in time range)  morphine 4 MG/ML injection 4 mg (4 mg Intravenous Given 12/03/18 1359)  ondansetron (ZOFRAN) injection 4 mg (4 mg Intravenous Given 12/03/18 1403)     Initial Impression / Assessment and Plan / ED Course  I have reviewed the triage vital signs and the nursing notes.  Pertinent labs & imaging results that were available during my care of the patient were reviewed by me and considered in my medical decision making (see chart for details).  Clinical Course as of Dec 02 1605  Sat Dec 03, 2018  1255 Patient's mental status is suggestive of either a concussion or possibly a postictal state.  There is no reported trauma or seizure activity.   [JK]  1356 Now complaining of more significant pain in the anterior chest and neck   [JK]  1432 Verbal radiology report shows a small subarachnoid hemorrhage.   [JK]  1433 This certainly accounts for the patient's headache and confusion.  Does remain awake and alert.  GCS 14.  I discussed the findings with the patient and his wife.  [facility] and arrange for transfer and admission   [JK]  1455 D/w Vinny, neurosurgery.  Would like to CT angio first to determine who/where  to admit.  CT angio already  ordered.   [JK]  1517 Discussed with neurosurg.   No surgical intervention needed at this time.  Requests neurology vs medicine admit   [JK]  1518 BP elevated at 143/92.  Will start cleviprex at low dose.   [JK]  1534 D/w Dr Chestine Spore.  Could be a small AVM.     [JK]  1550 D/w Aroor.  Would recommend critical care for admission.   [JK]  1601 D/w Dr Marchelle Gearing.  With recent covid 19 pandemic there is a  surge plan in place.  Neurosurg and neurology are to admit these patients directly.  Not critical care.   They are happy to consult if needed.   [JK]  1607 D/w PA Costella.  Neurosurg will admit directly   [JK]    Clinical Course User Index [JK] Linwood Dibbles, MD     Final Clinical Impressions(s) / ED Diagnoses   Final diagnoses:  Subarachnoid hemorrhage [facility])      Linwood Dibbles, MD 12/03/18 [phone]

## 2018-12-04 ENCOUNTER — Inpatient Hospital Stay (HOSPITAL_COMMUNITY): Payer: Self-pay

## 2018-12-04 NOTE — Evaluation (Signed)
Occupational Therapy Evaluation Patient Details Name: Aaron Soto MRN: [id number] DOB: 06/15/1966 Today's Date: 12/04/2018    History of Present Illness 53 y.o. 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 Undergoing workup; due to a spinal cord stimulator, we cannot obtain an MRI/MRA. Considering diagnistic angiogram early next week   Clinical Impression   PTA patient independent and working.  Admitted for above and limited by pain, decreased activity tolerance, decreased functional use/ROM of R UE, impaired balance, and impaired cognition (slow processing, decreased awareness and decreased STM).  Patient currently able to complete grooming with setup assistance, UB ADLs with min assist, LB ADLs with mod assist, and basic transfers (simulated toilet transfer) with min assist +2 safety.  Patient will benefit from continued OT services while admitted to maximize independence and safety with ADLs/mobility, at this time recommending HHOT services at discharge.  Will follow and update as needed.     Follow Up Recommendations  Supervision/Assistance - 24 hour;Home health OT(may progress to NO follow up needed)    Equipment Recommendations  3 in 1 bedside commode    Recommendations for Other Services       Precautions / Restrictions Precautions Precautions: Fall Precaution Comments: Fall risk relatively low, but still present      Mobility Bed Mobility Overal bed mobility: Needs Assistance Bed Mobility: Supine to Sit     Supine to sit: Min guard     General bed mobility comments: Grimace as he used the RUE to push up to sit, L UE support required with cueing for technique  Transfers Overall transfer level: Needs assistance Equipment used: 2 person hand held assist Transfers: Sit to/from Stand Sit to Stand: Min assist         General transfer comment: Min assist for balance    Balance Overall balance assessment: Needs  assistance Sitting-balance support: No upper extremity supported;Feet supported Sitting balance-Leahy Scale: Fair     Standing balance support: Single extremity supported;During functional activity Standing balance-Leahy Scale: Fair   Single Leg Stance - Right Leg: 5 Single Leg Stance - Left Leg: 5                       ADL either performed or assessed with clinical judgement   ADL Overall ADL's : Needs assistance/impaired     Grooming: Wash/dry hands;Wash/dry face;Set up;Sitting   Upper Body Bathing: Minimal assistance;Sitting   Lower Body Bathing: Moderate assistance;Sit to/from stand   Upper Body Dressing : Minimal assistance;Sitting   Lower Body Dressing: Moderate assistance;Sit to/from stand   Toilet Transfer: Minimal Cabin crewassistance;Stand-pivot Toilet Transfer Details (indicate cue type and reason): simulated to recliner          Functional mobility during ADLs: Minimal assistance General ADL Comments: pt limited by pain, decreased functional use of dominant R UE, poor act tolerance      Vision Baseline Vision/History: Wears glasses Wears Glasses: Reading only Patient Visual Report: Blurring of vision Additional Comments: appears WFL, further assessment warrented     Perception     Praxis      Pertinent Vitals/Pain Pain Assessment: Faces Faces Pain Scale: Hurts even more Pain Location: headache, R clavice/UE  Pain Descriptors / Indicators: Aching Pain Intervention(s): Monitored during session(opted to keep NWB until imaging results)     Hand Dominance Right   Extremity/Trunk Assessment Upper Extremity Assessment Upper Extremity Assessment: RUE deficits/detail RUE Deficits / Details: limited ROM due to pain, shoulder to 45 degrees;  distal to shoulder WFL ROM; generalized weakness 3+/5; 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  RUE: Unable to fully assess due to pain RUE Sensation: WNL RUE Coordination: decreased gross motor   Lower Extremity  Assessment Lower Extremity Assessment: Defer to PT evaluation       Communication Communication Communication: No difficulties   Cognition Arousal/Alertness: Awake/alert Behavior During Therapy: WFL for tasks assessed/performed Overall Cognitive Status: Impaired/Different from baseline Area of Impairment: Awareness;Memory;Following commands;Safety/judgement;Problem solving                     Memory: Decreased short-term memory Following Commands: Follows one step commands consistently;Follows one step commands with increased time Safety/Judgement: Decreased awareness of deficits;Decreased awareness of safety Awareness: Emergent Problem Solving: Slow processing;Requires verbal cues General Comments: Pt reports some slow processing and does not recall the AMS he experienced at work; able to follow commands, requires min cueing for problem solving; further functional assessment recommended with pill box test   General Comments  VSS    Exercises     Shoulder Instructions      Home Living Family/patient expects to be discharged to:: Private residence Living Arrangements: Spouse/significant other Available Help at Discharge: Family;Available 24 hours/day Type of Home: House Home Access: Stairs to enter [employer] of Steps: 6 Entrance Stairs-Rails: Right Home Layout: One level     Bathroom Shower/Tub: Tub/shower unit;Walk-in shower(uses tub shower )   Bathroom Toilet: Standard                Prior Functioning/Environment Level of Independence: Independent                 OT Problem List: Decreased strength;Decreased range of motion;Decreased activity tolerance;Impaired balance (sitting and/or standing);Decreased coordination;Decreased cognition;Decreased safety awareness;Decreased knowledge of use of DME or AE;Impaired UE functional use;Pain;Decreased knowledge of precautions      OT Treatment/Interventions: Self-care/ADL  training;Neuromuscular education;DME and/or AE instruction;Therapeutic activities;Cognitive remediation/compensation;Patient/family education;Balance training;Visual/perceptual remediation/compensation    OT Goals(Current goals can be found in the care plan section) Acute Rehab OT Goals Patient Stated Goal: Hopes to get home soon OT Goal Formulation: With patient Time For Goal Achievement: 12/18/18 Potential to Achieve Goals: Good  OT Frequency: Min 3X/week   Barriers to D/C:            Co-evaluation              AM-PAC OT "6 Clicks" Daily Activity     Outcome Measure Help from another person eating meals?: Total(NPO) Help from another person taking care of personal grooming?: A Little Help from another person toileting, which includes using toliet, bedpan, or urinal?: A Lot Help from another person bathing (including washing, rinsing, drying)?: A Lot Help from another person to put on and taking off regular upper body clothing?: A Little Help from another person to put on and taking off regular lower body clothing?: A Lot 6 Click Score: 13   End of Session Equipment Utilized During Treatment: Gait belt Nurse Communication: Mobility status  Activity Tolerance: Patient tolerated treatment well Patient left: in chair;with call bell/phone within reach;with chair alarm set  OT Visit Diagnosis: Other abnormalities of gait and mobility (R26.89);Pain;Muscle weakness (generalized) (M62.81);Other symptoms and signs involving cognitive function Pain - Right/Left: Right Pain - part of body: Arm(clavicle, headahce)                Time: 8295-6213 OT Time Calculation (min): 28 min Charges:  OT General Charges $OT Visit: 1 Visit OT Evaluation $OT Eval  Moderate Complexity: 1 Mod  Chancy Milroy, [place] [facility] Pager [phone] Office [phone]   Chancy Milroy 12/04/2018, 1:12 PM

## 2018-12-04 NOTE — Progress Notes (Signed)
  NEUROSURGERY PROGRESS NOTE   No issues overnight.  Complains of persistent bifrontal headache improved with hydrocodone. Associated with photophobia. Denies changes in vision.  Continues to endorse "foggy headedness"  EXAM:  BP 110/78   Pulse 81   Temp 98 F (36.7 C) (Oral)   Resp 11   Ht 5\' 11"  (1.803 m)   Wt 113.5 kg   SpO2 98%   BMI 34.90 kg/m   Awake, alert, oriented x4  Speech fluent, appropriate  CN grossly intact  5/5 BUE/BLE  No drift  IMPRESSION/PLAN 53 y.o. male ?nontraumatic SAH d#2 with underlying possible vascular malformation. Remains neurologically intact. - Repeat head CT. If stable or without significant change okay for normal diet.  In prep for possible angiogram tomorrow, will keep NPO at midnight. Angio ultimately will be determined by Dr Conchita Paris - Continue supportive care   Cindra Presume, [facility]

## 2018-12-04 NOTE — Evaluation (Signed)
Physical Therapy Evaluation Patient Details Name: Aaron LefevreKeith L Menard MRN: [id number] DOB: 02/25/1966 Today's Date: 12/04/2018   History of Present Illness  53 y.o. 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 Undergoing workup; due to a spinal cord stimulator, we cannot obtain an MRI/MRA. Considering diagnistic angiogram early next week  Clinical Impression   Pt admitted with above diagnosis. Pt currently with functional limitations due to the deficits listed below (see PT Problem List). Independent and working Prior to admission; presents with decr activity tolerance, decr functional mobility,  impaired balance, and impaired cognition (slow processing, decreased awareness and decreased STM). Currently needing mod assist for balance challenges;  Pt will benefit from skilled PT to increase their independence and safety with mobility to allow discharge to the venue listed below.       Follow Up Recommendations Home health PT;Supervision/Assistance - 24 hour(may progress well enough to not need HHPT)    Equipment Recommendations  Other (comment)(Perhaps cane versus RW)    Recommendations for Other Services       Precautions / Restrictions Precautions Precautions: Fall Precaution Comments: Fall risk relatively low, but still present      Mobility  Bed Mobility Overal bed mobility: Needs Assistance Bed Mobility: Supine to Sit     Supine to sit: Min guard     General bed mobility comments: Grimace as he used the RUE to push up to sit, L UE support required with cueing for technique  Transfers Overall transfer level: Needs assistance Equipment used: 2 person hand held assist Transfers: Sit to/from Stand Sit to Stand: Min assist         General transfer comment: Min assist for balance  Ambulation/Gait Ambulation/Gait assistance: Min assist Gait Distance (Feet): (March in place and pivotal steps to chair) Assistive device: 2 person hand  held assist       General Gait Details: [facility] and took pivotal steps to recliner; mildly unsteady  Information systems managertairs            Wheelchair Mobility    Modified Rankin (Stroke Patients Only) Modified Rankin (Stroke Patients Only) Pre-Morbid Rankin Score: No symptoms Modified Rankin: Moderate disability     Balance Overall balance assessment: Needs assistance Sitting-balance support: No upper extremity supported;Feet supported Sitting balance-Leahy Scale: Fair     Standing balance support: Single extremity supported;During functional activity Standing balance-Leahy Scale: Fair   Single Leg Stance - Right Leg: 5 Single Leg Stance - Left Leg: 5                         Pertinent Vitals/Pain Pain Assessment: Faces Faces Pain Scale: Hurts even more Pain Location: headache, R clavice/UE  Pain Descriptors / Indicators: Aching Pain Intervention(s): Monitored during session(opted to keep NWB until imaging results)    Home Living Family/patient expects to be discharged to:: Private residence Living Arrangements: Spouse/significant other Available Help at Discharge: Family;Available 24 hours/day Type of Home: House Home Access: Stairs to enter Entrance Stairs-Rails: Right Entrance Stairs-Number of Steps: 6 Home Layout: One level        Prior Function Level of Independence: Independent               Hand Dominance   Dominant Hand: Right    Extremity/Trunk Assessment   Upper Extremity Assessment Upper Extremity Assessment: RUE deficits/detail RUE Deficits / Details: limited ROM due to pain, shoulder to 45 degrees; distal to shoulder WFL ROM; generalized  weakness 3+/5; 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  RUE: Unable to fully assess due to pain RUE Sensation: WNL RUE Coordination: decreased gross motor    Lower Extremity Assessment Lower Extremity Assessment: Defer to PT evaluation       Communication   Communication: No difficulties   Cognition Arousal/Alertness: Awake/alert Behavior During Therapy: WFL for tasks assessed/performed Overall Cognitive Status: Impaired/Different from baseline Area of Impairment: Awareness;Memory;Following commands;Safety/judgement;Problem solving                     Memory: Decreased short-term memory Following Commands: Follows one step commands consistently;Follows one step commands with increased time Safety/Judgement: Decreased awareness of deficits;Decreased awareness of safety Awareness: Emergent Problem Solving: Slow processing;Requires verbal cues General Comments: Pt reports some slow processing and does not recall the AMS he experienced at work; able to follow commands, requires min cueing for problem solving; further functional assessment recommended with pill box test      General Comments General comments (skin integrity, edema, etc.): VSS    Exercises     Assessment/Plan    PT Assessment Patient needs continued PT services  PT Problem List Decreased strength;Decreased activity tolerance;Decreased balance;Decreased mobility;Decreased coordination;Decreased cognition;Decreased knowledge of use of DME;Decreased safety awareness;Decreased knowledge of precautions;Pain       PT Treatment Interventions DME instruction;Gait training;Stair training;Functional mobility training;Therapeutic activities;Therapeutic exercise;Balance training;Patient/family education;Neuromuscular re-education;Cognitive remediation    PT Goals (Current goals can be found in the Care Plan section)  Acute Rehab PT Goals Patient Stated Goal: Hopes to get home soon PT Goal Formulation: With patient Time For Goal Achievement: 12/18/18 Potential to Achieve Goals: Good    Frequency Min 4X/week   Barriers to discharge        Co-evaluation               AM-PAC PT "6 Clicks" Mobility  Outcome Measure Help needed turning from your back to your side while in a flat bed without using  bedrails?: None Help needed moving from lying on your back to sitting on the side of a flat bed without using bedrails?: A Little Help needed moving to and from a bed to a chair (including a wheelchair)?: A Little Help needed standing up from a chair using your arms (e.g., wheelchair or bedside chair)?: A Little Help needed to walk in hospital room?: A Little Help needed climbing 3-5 steps with a railing? : A Little 6 Click Score: 19    End of Session Equipment Utilized During Treatment: Gait belt Activity Tolerance: Patient tolerated treatment well Patient left: in chair;with call bell/phone within reach;with chair alarm set Nurse Communication: Mobility status PT Visit Diagnosis: Unsteadiness on feet (R26.81);Other abnormalities of gait and mobility (R26.89)    Time: 6599-3570 PT Time Calculation (min) (ACUTE ONLY): 28 min   Charges:   PT Evaluation $PT Eval Moderate Complexity: 1 Mod          Van Clines, [place]  Acute Rehabilitation Services Pager [phone] Office [phone]   Levi Aland 12/04/2018, 2:31 PM

## 2018-12-04 NOTE — Progress Notes (Signed)
Video call completed with wife, Aaron Soto. Pt alert and oriented. Educated on elink and video call. 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

## 2018-12-04 NOTE — Progress Notes (Signed)
PT Cancellation Note  Patient Details Name: Aaron Soto MRN: [id number] DOB: [birth year]   Cancelled Treatment:    Reason Eval/Treat Not Completed: Patient at procedure or test/unavailable   Will follow,   Van Clines, PT  Acute Rehabilitation Services Pager [phone] Office [phone]    Levi Aland 12/04/2018, 9:30 AM

## 2018-12-04 NOTE — Progress Notes (Signed)
OT Cancellation Note  Patient Details Name: Aaron Soto MRN: [id number] DOB: July 25, 1966   Cancelled Treatment:    Reason Eval/Treat Not Completed: Patient at procedure or test/ unavailable. Off unit.  Will follow.  Chancy Milroy, OT Acute Rehabilitation Services Pager [phone] Office [phone]   Chancy Milroy 12/04/2018, 10:05 AM

## 2018-12-05 ENCOUNTER — Encounter (HOSPITAL_COMMUNITY): Payer: Self-pay | Admitting: Neurosurgery

## 2018-12-05 ENCOUNTER — Inpatient Hospital Stay (HOSPITAL_COMMUNITY): Payer: Self-pay

## 2018-12-05 HISTORY — PX: IR ANGIO EXTERNAL CAROTID SEL EXT CAROTID BILAT MOD SED: IMG5372

## 2018-12-05 HISTORY — PX: IR ANGIO INTRA EXTRACRAN SEL INTERNAL CAROTID BILAT MOD SED: IMG5363

## 2018-12-05 HISTORY — PX: IR ANGIO VERTEBRAL SEL VERTEBRAL UNI R MOD SED: IMG5368

## 2018-12-05 LAB — HIV ANTIBODY (ROUTINE TESTING W REFLEX): HIV Screen 4th Generation wRfx: NONREACTIVE

## 2018-12-05 MED ORDER — MIDAZOLAM HCL 2 MG/2ML IJ SOLN
INTRAMUSCULAR | Status: AC
  Filled 2018-12-05: qty 2

## 2018-12-05 MED ORDER — MIDAZOLAM HCL 2 MG/2ML IJ SOLN
INTRAMUSCULAR | Status: AC | PRN
  Administered 2018-12-05: 1 mg via INTRAVENOUS

## 2018-12-05 MED ORDER — HEPARIN SODIUM (PORCINE) 1000 UNIT/ML IJ SOLN
INTRAMUSCULAR | Status: AC
  Filled 2018-12-05: qty 1

## 2018-12-05 MED ORDER — FENTANYL CITRATE (PF) 100 MCG/2ML IJ SOLN
INTRAMUSCULAR | Status: AC
  Filled 2018-12-05: qty 2

## 2018-12-05 MED ORDER — IOHEXOL 300 MG/ML  SOLN: 25.0000 mL | Freq: Once | INTRAMUSCULAR | Status: DC | PRN

## 2018-12-05 MED ORDER — FENTANYL CITRATE (PF) 100 MCG/2ML IJ SOLN
INTRAMUSCULAR | Status: AC | PRN
  Administered 2018-12-05: 25 ug via INTRAVENOUS

## 2018-12-05 MED ORDER — HEPARIN SODIUM (PORCINE) 1000 UNIT/ML IJ SOLN
INTRAMUSCULAR | Status: AC | PRN
  Administered 2018-12-05: 2000 [IU] via INTRAVENOUS

## 2018-12-05 MED ORDER — LIDOCAINE HCL 1 % IJ SOLN
INTRAMUSCULAR | Status: AC
  Filled 2018-12-05: qty 20

## 2018-12-05 NOTE — Progress Notes (Signed)
Physical Therapy Treatment Patient Details Name: Aaron LefevreKeith L Soto MRN: [id number] DOB: 11/20/1965 Today's Date: 12/05/2018    History of Present Illness 53 y.o. 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 Spinal cord stimulator present so cannot obtain an MRI/MRA. s/p cerebral angiogram 4/13- normal.    PT Comments    Patient progressing well towards PT goals. Improved ambulation distance today with Min guard for balance/safety. Reports headache worsens with activity and is sensitive to light. VSS throughout. Able to perform dual tasking during ambulation and noted some difficulty esp when fatigued. Reports not feeling back to baseline with regards to cognition. Will continue to follow and progress as tolerated.     Follow Up Recommendations  Home health PT;Supervision/Assistance - 24 hour     Equipment Recommendations  None recommended by PT    Recommendations for Other Services       Precautions / Restrictions Precautions Precautions: Fall Restrictions Weight Bearing Restrictions: No    Mobility  Bed Mobility Overal bed mobility: Needs Assistance Bed Mobility: Supine to Sit;Sit to Supine     Supine to sit: Min guard;HOB elevated Sit to supine: Min guard;HOB elevated   General bed mobility comments: Min guard for safety. Grimacing when pushing up/lowering down through RUE.   Transfers Overall transfer level: Needs assistance Equipment used: None Transfers: Sit to/from Stand Sit to Stand: Min guard         General transfer comment: Min guard for safety. Stood from EOB, no dizziness.   Ambulation/Gait Ambulation/Gait assistance: Min guard Gait Distance (Feet): 1000 Feet Assistive device: None Gait Pattern/deviations: Step-through pattern;Decreased stride length;Drifts right/left   Gait velocity interpretation: 1.31 - 2.62 ft/sec, indicative of limited community ambulator General Gait Details: Slow, mostly steady gait  with some deviations noted with mental tasks. Missed room during hallway navigation needing cues.    Stairs             Wheelchair Mobility    Modified Rankin (Stroke Patients Only) Modified Rankin (Stroke Patients Only) Pre-Morbid Rankin Score: No symptoms Modified Rankin: Moderate disability     Balance Overall balance assessment: Needs assistance Sitting-balance support: No upper extremity supported;Feet supported Sitting balance-Leahy Scale: Fair Sitting balance - Comments: Able to reach outside BoS and donn socks sitting EOB without LOB.   Standing balance support: During functional activity Standing balance-Leahy Scale: Fair                              Cognition Arousal/Alertness: Awake/alert Behavior During Therapy: WFL for tasks assessed/performed Overall Cognitive Status: Impaired/Different from baseline Area of Impairment: Memory;Problem solving                             Problem Solving: Slow processing General Comments: A&Ox3- difficulty getting specific date but knows it is April 2020 and Monday. Reports not feeling at baseline with thinking. Does not recall event. Able to perform dual tasking- serial counting during ambulation with some difficulty esp when fatigued.       Exercises      General Comments General comments (skin integrity, edema, etc.): VSS throughout; HA worsened with mobility and with light.       Pertinent Vitals/Pain Pain Assessment: 0-10 Pain Score: 7  Pain Location: headache, R clavice/UE  Pain Descriptors / Indicators: Headache;Sore;Tender;Discomfort Pain Intervention(s): Monitored during session;Repositioned    Home Living  Prior Function            PT Goals (current goals can now be found in the care plan section) Progress towards PT goals: Progressing toward goals    Frequency    Min 4X/week      PT Plan Current plan remains appropriate     Co-evaluation PT/OT/SLP Co-Evaluation/Treatment: Yes Reason for Co-Treatment: For patient/therapist safety;To address functional/ADL transfers PT goals addressed during session: Mobility/safety with mobility;Balance        AM-PAC PT "6 Clicks" Mobility   Outcome Measure  Help needed turning from your back to your side while in a flat bed without using bedrails?: None Help needed moving from lying on your back to sitting on the side of a flat bed without using bedrails?: A Little Help needed moving to and from a bed to a chair (including a wheelchair)?: None Help needed standing up from a chair using your arms (e.g., wheelchair or bedside chair)?: None Help needed to walk in hospital room?: A Little Help needed climbing 3-5 steps with a railing? : A Little 6 Click Score: 21    End of Session Equipment Utilized During Treatment: Gait belt Activity Tolerance: Patient tolerated treatment well Patient left: in bed;with call bell/phone within reach;with bed alarm set Nurse Communication: Mobility status PT Visit Diagnosis: Unsteadiness on feet (R26.81);Other abnormalities of gait and mobility (R26.89)     Time: 1430-1457 PT Time Calculation (min) (ACUTE ONLY): 27 min  Charges:  $Gait Training: 8-22 mins                     Mylo Red, [place], DPT Acute Rehabilitation Services Pager [phone] Office [phone]       Blake Divine A Lanier Ensign 12/05/2018, 3:46 PM

## 2018-12-05 NOTE — Progress Notes (Signed)
PT Cancellation Note  Patient Details Name: MATHAIS RUNKLES MRN: [id number] DOB: May 07, 1966   Cancelled Treatment:    Reason Eval/Treat Not Completed: Patient at procedure or test/unavailable pt off floor at IR. Will follow up as time allows.   Blake Divine A Kyliee Ortego 12/05/2018, 11:22 AM Mylo Red, PT, DPT Acute Rehabilitation Services Pager [phone] Office [phone]

## 2018-12-05 NOTE — Progress Notes (Signed)
Occupational Therapy Treatment Patient Details Name: Aaron Soto MRN: [id number] DOB: [birth year] Today's Date: 12/05/2018    History of present illness 53 y.o. 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 Spinal cord stimulator present so cannot obtain an MRI/MRA. s/p cerebral angiogram 4/13- normal.   OT comments  Pt able to perform ADLs at min guard assist level. Cognition appears to be improving, but will continue to assess higher level skills  Min guard assist for ADLs.   Follow Up Recommendations  Supervision/Assistance - 24 hour;Home health OT    Equipment Recommendations  None recommended by OT    Recommendations for Other Services       Precautions / Restrictions Precautions Precautions: Fall Precaution Comments: Fall risk relatively low, but still present Restrictions Weight Bearing Restrictions: No       Mobility Bed Mobility Overal bed mobility: Needs Assistance Bed Mobility: Supine to Sit;Sit to Supine     Supine to sit: Min guard Sit to supine: Min guard   General bed mobility comments: Min guard for safety. Grimacing when pushing up/lowering down through RUE.   Transfers Overall transfer level: Needs assistance Equipment used: None Transfers: Sit to/from [employer] Sit to Stand: Min guard Stand pivot transfers: Min guard       General transfer comment: Min guard for safety. Stood from EOB, no dizziness.     Balance Overall balance assessment: Needs assistance Sitting-balance support: No upper extremity supported;Feet supported Sitting balance-Leahy Scale: Good Sitting balance - Comments: Able to reach outside BoS and donn socks sitting EOB without LOB.   Standing balance support: During functional activity Standing balance-Leahy Scale: Fair                             ADL either performed or assessed with clinical judgement   ADL Overall ADL's : Needs  assistance/impaired         Upper Body Bathing: Set up   Lower Body Bathing: Min guard;Sit to/from stand       Lower Body Dressing: Min guard;Sit to/from stand   Toilet Transfer: Min guard;Ambulation;Comfort height toilet           Functional mobility during ADLs: Min guard       Vision   Additional Comments: appears [facility]    Perception     Praxis      Cognition Arousal/Alertness: Awake/alert Behavior During Therapy: WFL for tasks assessed/performed Overall Cognitive Status: Impaired/Different from baseline Area of Impairment: Attention;Memory;Awareness;Problem solving                   Current Attention Level: Selective;Alternating Memory: Decreased short-term memory     Awareness: Emergent Problem Solving: Slow processing General Comments: Pt able to perform serial 2s to and from 100 with no errors while ambulating.  He was able to recall events of day         Exercises     Shoulder Instructions       General Comments VSS     Pertinent Vitals/ Pain       Pain Assessment: 0-10 Pain Score: 7  Pain Location: headache, R clavice/UE  Pain Descriptors / Indicators: Headache;Sore;Tender;Discomfort Pain Intervention(s): Monitored during session  Home Living  Prior Functioning/Environment              Frequency  Min 3X/week        Progress Toward Goals  OT Goals(current goals can now be found in the care plan section)  Progress towards OT goals: Progressing toward goals     Plan Discharge plan remains appropriate;Equipment recommendations need to be updated    Co-evaluation    PT/OT/SLP Co-Evaluation/Treatment: Yes Reason for Co-Treatment: For patient/therapist safety;To address functional/ADL transfers PT goals addressed during session: Mobility/safety with mobility;Balance OT goals addressed during session: ADL's and self-care      AM-PAC OT "6 Clicks" Daily  Activity     Outcome Measure   Help from another person eating meals?: None Help from another person taking care of personal grooming?: A Little Help from another person toileting, which includes using toliet, bedpan, or urinal?: A Little Help from another person bathing (including washing, rinsing, drying)?: A Little Help from another person to put on and taking off regular upper body clothing?: A Little Help from another person to put on and taking off regular lower body clothing?: A Little 6 Click Score: 19    End of Session Equipment Utilized During Treatment: Gait belt  OT Visit Diagnosis: Other abnormalities of gait and mobility (R26.89);Pain;Muscle weakness (generalized) (M62.81);Other symptoms and signs involving cognitive function Pain - Right/Left: Right Pain - part of body: Arm   Activity Tolerance Patient tolerated treatment well   Patient Left in bed;with call bell/phone within reach   Nurse Communication Mobility status        Time: 1430-1457 OT Time Calculation (min): 27 min  Charges: OT General Charges $OT Visit: 1 Visit OT Treatments $Therapeutic Activity: 8-22 mins  Jeani HawkingWendi Andreal [place], OTR/L Acute Rehabilitation Services Pager [phone] Office [phone]    Jeani HawkingConarpe, Juliane Guest M 12/05/2018, 5:03 PM

## 2018-12-05 NOTE — Procedures (Signed)
SURGEON: Dr. Lisbeth Renshaw, MD  ANESTHESIA: IV Sedation with Local  EBL: Minimal  SPECIMENS: None  COMPLICATIONS: None  CONDITION: Stable to ICU  FINDINGS (Full report in [facility]): 1. Normal diagnostic angiogram, no aneurysms, AVM, or fistulas identified.

## 2018-12-05 NOTE — Progress Notes (Signed)
  NEUROSURGERY PROGRESS NOTE   History reviewed, no issues overnight. Briefly, pt initially experienced confusion at work, unable to remember exactly what happened. Developed HA subsequent to the confusion. Denies associated visual change, N/T/W. Hx of controlled HTN, no smoking hx, no family hx of hemorrhage.  EXAM:  BP 129/85   Pulse 72   Temp 98.2 F (36.8 C) (Oral)   Resp 13   Ht 5\' 11"  (1.803 m)   Wt 113.5 kg   SpO2 94%   BMI 34.90 kg/m   Awake, alert, oriented  Speech fluent, appropriate  CN grossly intact  5/5 BUE/BLE   IMAGING: CTH demonstrates small punctate focus of hemorrhage in the posterior right sylvian fissure. CTA demonstrates ?serpiginous vessels in the sam region although this is quite subtle. No obvious aneurysms or AVM nidus is seen. I also do not see any large draining vein or venous varix although the right basal vein of Rosenthal and the right transverse-sigmoid sinus opacifies more prominently than the left.  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  PLAN: - Will proceed with diagnostic angiogram today for further w/u.  I have reviewed the imaging findings thus far with the patient. Details of the procedure, indications, and risks to include stroke, arterial injury, hematoma, nephropathy, allergic reaction were all reviewed. All his questions today were answered and consent was obtained and witnessed.

## 2018-12-06 NOTE — Discharge Summary (Signed)
Physician Discharge Summary  Patient ID: Aaron Soto MRN: [id number] DOB/AGE: July 25, 1966 53 y.o.  Admit date: 12/03/2018 Discharge date: 12/06/2018  Admission Diagnoses: Acute confusion and headache, small subarachnoid hemorrhage, question cerebrovascular lesion  Discharge Diagnoses: Unexplained acute confusional episode with headache, stable small subarachnoid hemorrhage, negative cerebral angiogram Active Problems:   Subarachnoid hemorrhage [facility])   Discharged Condition: Improved  Hospital Course: Patient presented after being found at work confused.  Evaluation at [facility], ER showed a small area of right parietal sulcal subarachnoid hemorrhage, and CTA raise question of vascular malformation.  Patient transferred to [facility] and follow-up CT was unchanged.  Cerebral arteriogram revealed no evidence of aneurysm , vascular malformation, or other vascular lesion.  Patient initially had bifrontal headache, he still has some right frontal headache, it is decreased as compared to presentation.  Patient is also had discomfort over the right clavicle.  Exam showed tenderness over the area of the right clavicle, but right clavicle x-ray was negative for fracture or dislocation.  The discomfort has lessened, and is now more localized to the sternoclavicular junction.  Patient is asking to be discharged home.  I spoke with the patient's wife on the evening of admission and again today.  We reviewed our evaluation and findings.  She explained that she had his employer review video from the day of presentation, and no unusual occurrences were seen while he was working that day.  I have explained to her that in the end the patient's confusional episode is unexplained, but that major pathology such as a vascular lesion have been ruled out.  His urine drug screen at [facility], ER was positive for opiates, but his wife notes that he did take a hydrocodone 5 or 6 days before the confusional episode (residual  metabolites may explain the positive finding of opiates).  We are discharging him to home to continue to recuperate, I have asked him to return for a follow-up visit with me in 2 to 3 weeks, and to follow-up with his primary physician Dr. Almond Lint in [facility] in the next week or 2.  If he has any recurrence of difficulties or increased difficulties, he is to return to the emergency room.  Ultimately he may need neurology evaluation if further problems arise.  On the other hand if he continues to improve, he will most likely be able to return to work and driving as of April 20.  I have explained to the patient and his wife that I favor the use of Tylenol, and NSAIDs if necessary for his headaches, rather than narcotic medications.  Discharge Exam: Blood pressure 115/66, pulse 86, temperature 97.6 F (36.4 C), temperature source Oral, resp. rate 11, height 5\' 11"  (1.803 m), weight 113.5 kg, SpO2 97 %. Patient is awake and alert, oriented to his name, Aaron Soto hospital, and April 2020.  He follows commands briskly.  His speech is fluent.  Pupils are equal round and reactive to light.  EOMI.  Facial movement symmetrical.  Motor examination shows 5/5 strength in the upper and lower extremities.  He has no drift of the upper extremities.  Disposition: Discharge disposition: 01-Home or Self Care       Discharge Instructions    Driving Restrictions   Complete by:  As directed    No driving for until at least April 20.  If stable and no further problems, can resume driving on April 20.   Other Restrictions   Complete by:  As directed  Walk with someone, gradually increasing distances out in the fresh air at least twice a day. Walking additional 6 times inside the house, gradually increasing distances, daily. Limitations until April 20, unless any other difficulties develop.  No bending, lifting, or twisting.  No heavy or strenuous activities.  Follow-up with Dr. Newell CoralNudelman  [phone]) in 2 to 3 weeks.  Follow-up with primary physician, Dr. Almond Linterry Daniels, within 1 to 2 weeks.     Allergies as of 12/06/2018   Not on File     Medication List    TAKE these medications   busPIRone 10 MG tablet Commonly known as:  BUSPAR Take 20 mg by mouth 3 (three) times daily.   cyclobenzaprine 10 MG tablet Commonly known as:  FLEXERIL Take 10 mg by mouth daily.   DULoxetine 60 MG capsule Commonly known as:  CYMBALTA Take 60 mg by mouth daily.   ibuprofen 200 MG tablet Commonly known as:  ADVIL,MOTRIN Take 200 mg by mouth every 6 (six) hours as needed for mild pain.   ketoprofen 75 MG capsule Commonly known as:  ORUDIS Take 75 mg by mouth every 8 (eight) hours as needed for mild pain.   lisinopril 10 MG tablet Commonly known as:  PRINIVIL,ZESTRIL Take 10 mg by mouth daily.   multivitamin tablet Take 1 tablet by mouth daily.   omeprazole 20 MG capsule Commonly known as:  PRILOSEC Take 20 mg by mouth daily.   OVER THE COUNTER MEDICATION Take by mouth See admin instructions. CBD Oil - 750 mg by mouth twice daily   vitamin B-12 500 MCG tablet Commonly known as:  CYANOCOBALAMIN Take 500 mcg by mouth daily.      Follow-up Information    Lisbeth RenshawNundkumar, Neelesh, MD Follow up in 4 week(s).   Specialty:  Neurosurgery Contact information: [address] [place] [address] [phone]        [facility]   Specialty:  Rehabilitation Why:  Rehab center will call you for appointment, or you may call to schedule. Contact information: [address] [id number] [place] [id number] [phone]          Signed: Hewitt ShortsUDELMAN,ROBERT W 12/06/2018, 1:31 PM

## 2018-12-06 NOTE — Progress Notes (Signed)
Occupational Therapy Treatment Patient Details Name: Aaron Soto MRN: [id number] DOB: 10-24-1965 Today's Date: 12/06/2018    History of present illness 53 y.o. 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 Spinal cord stimulator present so cannot obtain an MRI/MRA. s/p cerebral angiogram 4/13- normal.   OT comments  Pt is eager for discharge.  He deferred activity due to severity of headache.  Discussed appropriate activity level and need for supervision, as well as need for OPOT follow up at discharge.  Recommend he not return to work until cleared by OP therapies.  He verbalized understanding.   Follow Up Recommendations  Supervision/Assistance - 24 hour;Home health OT    Equipment Recommendations  None recommended by OT    Recommendations for Other Services      Precautions / Restrictions Precautions Precautions: Fall Precaution Comments: Fall risk relatively low, but still present       Mobility Bed Mobility                  Transfers                      Balance                                           ADL either performed or assessed with clinical judgement   ADL                                               Vision       Perception     Praxis      Cognition Arousal/Alertness: Awake/alert Behavior During Therapy: WFL for tasks assessed/performed Overall Cognitive Status: Impaired/Different from baseline Area of Impairment: Memory                                        Exercises Exercises: General Upper Extremity;Other exercises Other Exercises Other Exercises: Performed AAROM and AROM of Rt shoulder to 90* shoulder flexion, abuction, and horizontal abuction x 5 each.  Encouraged him to perform at home    Shoulder Instructions       General Comments Pt deferred activity this date due to severity of headache.  Discussed current deficits  with him, need for supervision with medication management, need for supervision at discharge.  recommended that he not return to work until cleared by OPOT and he agreed.  Pt shared that the last several years have been very stressful with job changes and he is uncertain re: the stability of his current job.  He also shared he feels he may have been having difficulty with his memory for the past several months.  Support provided and encouraged him to discuss counseling with MD if he feels he may need this     Pertinent Vitals/ Pain       Pain Assessment: 0-10 Pain Score: 6  Pain Location: Head Pain Descriptors / Indicators: Headache Pain Intervention(s): Limited activity within patient's tolerance  Home Living  Prior Functioning/Environment              Frequency  Min 3X/week        Progress Toward Goals  OT Goals(current goals can now be found in the care plan section)  Progress towards OT goals: Progressing toward goals     Plan Discharge plan needs to be updated    Co-evaluation                 AM-PAC OT "6 Clicks" Daily Activity     Outcome Measure   Help from another person eating meals?: None Help from another person taking care of personal grooming?: A Little Help from another person toileting, which includes using toliet, bedpan, or urinal?: A Little Help from another person bathing (including washing, rinsing, drying)?: A Little Help from another person to put on and taking off regular upper body clothing?: A Little Help from another person to put on and taking off regular lower body clothing?: A Little 6 Click Score: 19    End of Session    OT Visit Diagnosis: Cognitive communication deficit (R41.841) Pain - Right/Left: Right Pain - part of body: Arm   Activity Tolerance     Patient Left     Nurse Communication          Time: [id number] OT Time Calculation (min): 42  min  Charges: OT General Charges $OT Visit: 1 Visit OT Treatments $Therapeutic Activity: 23-37 mins  Jeani HawkingWendi Tiphani Mells, OTR/L Acute Rehabilitation Services Pager [phone] Office [phone]    Jeani HawkingConarpe, Ahjanae Cassel M 12/06/2018, 7:15 PM

## 2018-12-06 NOTE — TOC Transition Note (Signed)
Transition of Care [facility]) - CM/SW Discharge Note   Patient Details  Name: Aaron Soto MRN: [id number] Date of Birth: [birth year]  [facility] CM/SW Contact:  Glennon Mac, RN Phone Number: 12/06/2018, 3:35 PM   Clinical Narrative:   53 y.o. 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  PTA, pt independent, lives with spouse.  PT/OT recommending OP follow up.  Referral made to [facility]; rehab center will call pt for appointment.  Rehab information placed on AVS in Epic.         Final next level of care: OP Rehab Barriers to Discharge: No Barriers Identified    Discharge Plan and Services  Home/Self Care                            Readmission Risk Interventions No flowsheet data found.  Quintella Baton, RN, BSN  Trauma/Neuro ICU Case Manager [phone]

## 2018-12-06 NOTE — Evaluation (Signed)
Speech Language Pathology Evaluation Patient Details Name: Aaron Soto MRN: [id number] DOB: Feb 08, 1966 Today's Date: 12/06/2018 Time: 5456-2563 SLP Time Calculation (min) (ACUTE ONLY): 23 min  Problem List:  Patient Active Problem List   Diagnosis Date Noted  . Subarachnoid hemorrhage (HCC) 12/03/2018   Past Medical History:  Past Medical History:  Diagnosis Date  . Anxiety   . Depression   . Hypertension    Past Surgical History:  Past Surgical History:  Procedure Laterality Date  . BACK SURGERY    . CHOLECYSTECTOMY    . HAND SURGERY    . IR ANGIO EXTERNAL CAROTID SEL EXT CAROTID BILAT MOD SED  12/05/2018  . IR ANGIO INTRA EXTRACRAN SEL INTERNAL CAROTID BILAT MOD SED  12/05/2018  . IR ANGIO VERTEBRAL SEL VERTEBRAL UNI R MOD SED  12/05/2018  . SPINAL CORD STIMULATOR IMPLANT     HPI:  Pt is a 53 year old male who was brought to the [facility] emergency room after being found at work confused. CT scan showed small area of subarachnoid blood in the right anterior parietal region and he transferred to neurosurgical service at [facility]. MRI could not be conducted due to spinal cord stimulator.    Assessment / Plan / Recommendation Clinical Impression  Pt is currently employed as a Corporate treasurer. He denied any baseline speech/language deficits but stated that he has been having some difficulty with memory for the past 2-3 years. Pt was unsure as to whether it was notably worse since admission or whether he is just very tired. [facility] Cognitive Assessment 8.1 was completed to evaluate the pt's cognitive-linguistic skills. He achieved a score of 20/30 which is below the normal limits of 26 or more out of 30 and is suggestive of a mild impairment. He demonstrated deficits in the areas of divergent naming, abstract reasoning, and memory. Skilled SLP services are clinically indicated at this time to improve cognition. Pt, and nursing were educated regarding  results and recommendations; both parties verbalized understanding as well as agreement with plan of care.    SLP Assessment  SLP Recommendation/Assessment: Patient needs continued Speech Lanaguage Pathology Services SLP Visit Diagnosis: Cognitive communication deficit (R41.841)    Follow Up Recommendations  Home health SLP    Frequency and Duration min 2x/week  2 weeks      SLP Evaluation Cognition  Overall Cognitive Status: Impaired/Different from baseline Arousal/Alertness: Awake/alert Orientation Level: Oriented X4 Attention: Focused;Sustained Focused Attention: Appears intact(Vigilance WNL: 1/1) Sustained Attention: Appears intact(Serial 7s: 3/3) Memory: Impaired Memory Impairment: Retrieval deficit;Decreased recall of new information(Immediate: 4/5; Delayed: 1/5; 3/4 with cues) Awareness: Appears intact Executive Function: Reasoning;Sequencing Reasoning: Impaired Reasoning Impairment: Verbal complex(Abstraction: 0/2) Sequencing: Appears intact(Clock drawing: 3/3)       Comprehension  Auditory Comprehension Overall Auditory Comprehension: Appears within functional limits for tasks assessed Yes/No Questions: Within Functional Limits Commands: Impaired Complex Commands: (Trail completion: 0/1) Conversation: Complex Visual Recognition/Discrimination Discrimination: Not tested Reading Comprehension Reading Status: Within funtional limits    Expression Expression Primary Mode of Expression: Verbal Verbal Expression Overall Verbal Expression: Appears within functional limits for tasks assessed Initiation: No impairment Level of Generative/Spontaneous Verbalization: Sentence;Conversation Repetition: Impaired Level of Impairment: Sentence level(1/2) Naming: Impairment Confrontation: (2/3) Divergent: (0/1) Pragmatics: No impairment Written Expression Dominant Hand: Right Written Expression: (Copying cube: 1/1)   Oral / Motor  Oral Motor/Sensory Function Overall  Oral Motor/Sensory Function: Within functional limits Motor Speech Overall Motor Speech: Appears within functional limits for tasks assessed Respiration: Within  functional limits Phonation: Normal Resonance: Within functional limits Articulation: Within functional limitis Intelligibility: Intelligible Motor Planning: Witnin functional limits Motor Speech Errors: Not applicable   Makaylyn Sinyard I. Vear ClockPhillips, MS, CCC-SLP Acute Rehabilitation Services Office number [phone] Pager [phone]                  Scheryl MartenShanika I Floraine Buechler 12/06/2018, 11:54 AM

## 2018-12-06 NOTE — Progress Notes (Signed)
Discharge orders received, pt for discharge home today,  IV D/C,  D/C instructions and Rx given with verbalized understanding.  Family at bedside to assist pt with discharge. Staff brought pt downstairs via wheelchair.  

## 2018-12-06 NOTE — Progress Notes (Signed)
Physical Therapy Treatment Patient Details Name: Aaron LefevreKeith L Shaddock MRN: [id number] DOB: 03/26/1966 Today's Date: 12/06/2018    History of Present Illness 53 y.o. 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 Spinal cord stimulator present so cannot obtain an MRI/MRA. s/p cerebral angiogram 4/13- normal.    PT Comments    Patient progressing well towards PT goals. Reports rough night and not sleeping very well due to back pain, headache and right clavicle/shoulder pain. Tolerated gait training and stair training with Min guard-supervision for safety. Some difficulty noted with slower processing and attention today. Balance worsened slightly with dual tasking during ambulation with slower gait speed and sometimes needing to stop overall. Also noted to have difficulty recalling words within session for STM. Reports feeling very tired and groggy. Discharge recommendation updated to neuro OPPT. Will follow.    Follow Up Recommendations  Supervision/Assistance - 24 hour;Outpatient PT     Equipment Recommendations  None recommended by PT    Recommendations for Other Services       Precautions / Restrictions Precautions Precautions: Fall Precaution Comments: Fall risk relatively low, but still present Restrictions Weight Bearing Restrictions: No    Mobility  Bed Mobility               General bed mobility comments: Up in chair upon PT arrival.  Transfers Overall transfer level: Needs assistance Equipment used: None Transfers: Sit to/from Stand Sit to Stand: Supervision         General transfer comment: Supervision for safety.   Ambulation/Gait Ambulation/Gait assistance: Min Emergency planning/management officerguard;Supervision Gait Distance (Feet): 1000 Feet Assistive device: None Gait Pattern/deviations: Step-through pattern;Decreased stride length;Drifts right/left;Decreased dorsiflexion - right   Gait velocity interpretation: 1.31 - 2.62 ft/sec, indicative of  limited community ambulator General Gait Details: Slow, mostly steady gait with some deviations noted during dual tasking- counting, needing to slow down etc. Decreased foot clearance on right. Able to step over things in hallway without difficulty.    Stairs Stairs: Yes Stairs assistance: Supervision Stair Management: One rail Left Number of Stairs: 7 General stair comments: Cues for technique, walked step over step and then backwards down the steps without difficulty.    Wheelchair Mobility    Modified Rankin (Stroke Patients Only) Modified Rankin (Stroke Patients Only) Pre-Morbid Rankin Score: No symptoms Modified Rankin: Slight disability     Balance Overall balance assessment: Needs assistance Sitting-balance support: No upper extremity supported;Feet supported Sitting balance-Leahy Scale: Good     Standing balance support: During functional activity Standing balance-Leahy Scale: Fair               High level balance activites: Backward walking;Direction changes High Level Balance Comments: Tolerated above with no deviations in gait; only noted with cognitive challenges.            Cognition Arousal/Alertness: Awake/alert Behavior During Therapy: WFL for tasks assessed/performed Overall Cognitive Status: Impaired/Different from baseline Area of Impairment: Attention;Memory;Awareness;Problem solving                   Current Attention Level: Selective;Alternating Memory: Decreased short-term memory Following Commands: Follows one step commands consistently;Follows one step commands with increased time   Awareness: Emergent Problem Solving: Slow processing;Requires verbal cues General Comments: Pt able to perform serial 7s from 100 with a few errors while ambulating as well as needing to stop and process multiple times; initially counting down by 10s instead of 7s.  1/3 words stated with STM recall during session.  Exercises      General  Comments General comments (skin integrity, edema, etc.): VSS throughout.      Pertinent Vitals/Pain Pain Assessment: 0-10 Pain Score: 6  Pain Location: headache, R clavice/UE  Pain Descriptors / Indicators: Headache;Sore;Tender;Discomfort Pain Intervention(s): Monitored during session;Repositioned;Premedicated before session    Home Living                      Prior Function            PT Goals (current goals can now be found in the care plan section) Progress towards PT goals: Progressing toward goals    Frequency    Min 4X/week      PT Plan Current plan remains appropriate    Co-evaluation              AM-PAC PT "6 Clicks" Mobility   Outcome Measure  Help needed turning from your back to your side while in a flat bed without using bedrails?: None Help needed moving from lying on your back to sitting on the side of a flat bed without using bedrails?: A Little Help needed moving to and from a bed to a chair (including a wheelchair)?: None Help needed standing up from a chair using your arms (e.g., wheelchair or bedside chair)?: None Help needed to walk in hospital room?: A Little Help needed climbing 3-5 steps with a railing? : A Little 6 Click Score: 21    End of Session Equipment Utilized During Treatment: Gait belt Activity Tolerance: Patient tolerated treatment well Patient left: in chair;with call bell/phone within reach Nurse Communication: Mobility status PT Visit Diagnosis: Unsteadiness on feet (R26.81);Other abnormalities of gait and mobility (R26.89)     Time: 3567-0141 PT Time Calculation (min) (ACUTE ONLY): 32 min  Charges:  $Gait Training: 8-22 mins $Neuromuscular Re-education: 8-22 mins                     Mylo Red, [place], DPT Acute Rehabilitation Services Pager [phone] Office [phone]       Blake Divine A Lanier Ensign 12/06/2018, 11:19 AM

## 2018-12-06 NOTE — Progress Notes (Signed)
SLP Cancellation Note  Patient Details Name: Aaron Soto MRN: [id number] DOB: 1965/09/17   Cancelled treatment:       Reason Eval/Treat Not Completed: Patient at procedure or test/unavailable. Pt currently working with OT. SLP will follow up.  Lydia Toren I. Vear Clock, MS, CCC-SLP Acute Rehabilitation Services Office number [phone] Pager [phone]  Scheryl Marten 12/06/2018, 10:59 AM

## 2018-12-07 ENCOUNTER — Telehealth (HOSPITAL_COMMUNITY): Payer: Self-pay

## 2018-12-07 NOTE — Telephone Encounter (Signed)
Aaron Soto was contacted today regarding temporary reduction of Outpatient Rehabilitation Services at [facility] due to concerns for community transmission of COVID-19.  Patient was asleep and his wife answered the phone. I discussed his care with her. Assessed if patient needed to be seen in person by clinician (recent fall or acute injury that requires hands on assessment and advice, change in diet order, post-surgical, special cases, etc.).    Patient did have an acute/special need that requires in person visit. Patient was scheduled for initial evaluation for OT and PT on 12/14/18.   The patient does not have insurance and will plan to discuss the best method to continue care if appropriate via in-person appointments and/or E-Visit, virtual check in, or Telehealth visit to continue their plan of care, when those services become available.  Outpatient Rehabilitation Services at [facility] will follow up with patient at evaluation.   Patient wife was provided office phone number and address as well as appointment times. She is aware we can be reached by telephone during limited business hours in the meantime.    Valentino Saxon, PT, DPT, [facility] Physical Therapist with [facility]  12/07/2018 2:02 PM

## 2018-12-13 ENCOUNTER — Encounter: Payer: Self-pay | Admitting: *Deleted

## 2018-12-13 ENCOUNTER — Other Ambulatory Visit: Payer: Self-pay | Admitting: *Deleted

## 2018-12-13 NOTE — Patient Outreach (Signed)
[facility]) Care Management  12/13/2018  Aaron Soto 1966/03/05 [id number]   EMMI- stroke-AP   RED ON EMMI ALERT Day # 6 Date: Monday 12/12/18 1002 caregiver Red Alert Reason: Feeling worse overall? Yes New or worsening pain/fever/shortness of breath? Yes Last visit [facility] ED to hospitalization for subarachnoid hemorrhage 12/03/18-12/06/18  Insurance: potential medicaid [facility] admissions x 1 ED visits x 1 in the last 6 months    Outreach attempt # 1 successful at the home number    Patient's wife Aaron Soto was able to verify HIPAA She report Aaron Soto is at work [employer] Care Management RN reviewed and addressed red alert with patient   EMMI: Aaron Soto confirms Aaron Soto was feeling worse overall on Sunday, Aprils 19, 2020 but felt better on today 12/13/2018 enough to return to work. She reports Aaron Soto described pain in his chest and back as muscle spasms, cramps He has also a scrape across his chest, When assessed she reports that she and he and been the shed in the back of the home cleaning but he had not lifted any items or completed any task that may have causes an injury. She reports offering to massage his muscles but he refused and stated it was painful to touch. She confirms hat Aaron Soto was contacted and called in a Rx for a 5 day course of prednisone to relieve muscle pain. She confirms the only other activity completed prior to the pain was an increase amount of walking during the weekend.  CM encouraged her to assess Aaron Soto for pain when he returns home today and to contact his MD if further concerns. CM provide CM's contact number along with the 24 hour nurse call center's number also.  Cm encouraged her to contact CM or nurse call center prn    Social: Aaron Soto is living with his wife Aaron Soto. He is independent with his care needs and transportation. Aaron Soto assists with care and transportation prn    Conditions: small, nontraumatic right parietal SAH with  questionable underlying AVM on CTA, HTN, anxiety, depression, back surgery, former smoker  DME: none  Medications: denies concerns with taking medications as prescribed, affording medications, side effects of medications and questions about medications   Appointments: 12/14/18 appointments listed in Epic for out patient rehab but Aaron Soto states Aaron Soto has decided he did not prefer to go to rehab. Cm discussed with her that if he had further worsening s/s that the outpatient rehab staff and MD would be able to evaluate Aaron Soto. She reports to CM that Aaron Soto would prefer to cancel the outpatient rehab as he has returned to work and has a contact number for the outpatient rehab center to discuss this with them.   Advance Directives: Denies need for assist with advance directives    Consent: [facility] RN CM reviewed [facility] services with patient. Patient gave verbal consent for services [facility] telephonic RN CM.   Advised patient that there will be further automated EMMI- post discharge calls to assess how the patient is doing following the recent hospitalization Advised the patient' wife that another call may be received from a nurse if any of their responses were abnormal. Aaron Soto voiced understanding and was appreciative of f/u call.   Plan: [facility] RN CM will close case at this time as patient has been assessed and no needs identified/needs resolved.   Pt encouraged to return a call to [facility] RN CM prn  [facility] RN CM sent a  successful outreach letter as discussed [name] enclosed for review  Routed note to MDs   Aaron BradfordKimberly L. Noelle PennerGibbs, RN, BSN, CCM [facility] Telephonic Care Management Care Coordinator Office number [phone] Mobile number [phone]  Main [facility] number [phone] Fax number [phone]

## 2018-12-14 ENCOUNTER — Ambulatory Visit (HOSPITAL_COMMUNITY): Payer: MEDICAID

## 2020-09-01 ENCOUNTER — Other Ambulatory Visit: Payer: Self-pay

## 2020-09-01 ENCOUNTER — Encounter (HOSPITAL_COMMUNITY): Payer: Self-pay | Admitting: Emergency Medicine

## 2020-09-01 ENCOUNTER — Emergency Department (HOSPITAL_COMMUNITY): Payer: Managed Care, Other (non HMO)

## 2020-09-01 ENCOUNTER — Inpatient Hospital Stay (HOSPITAL_COMMUNITY)
Admission: EM | Admit: 2020-09-01 | Discharge: 2020-09-05 | DRG: 177 | Disposition: A | Discharge status: Home / Self Care | Payer: Managed Care, Other (non HMO) | Attending: Internal Medicine | Admitting: Internal Medicine

## 2020-09-01 DIAGNOSIS — E860 Dehydration: Secondary | ICD-10-CM | POA: Diagnosis present

## 2020-09-01 DIAGNOSIS — E871 Hypo-osmolality and hyponatremia: Secondary | ICD-10-CM | POA: Diagnosis present

## 2020-09-01 DIAGNOSIS — U071 COVID-19: Secondary | ICD-10-CM | POA: Diagnosis present

## 2020-09-01 DIAGNOSIS — E669 Obesity, unspecified: Secondary | ICD-10-CM | POA: Diagnosis present

## 2020-09-01 DIAGNOSIS — I10 Essential (primary) hypertension: Secondary | ICD-10-CM | POA: Diagnosis present

## 2020-09-01 DIAGNOSIS — J1282 Pneumonia due to coronavirus disease 2019: Secondary | ICD-10-CM | POA: Diagnosis not present

## 2020-09-01 DIAGNOSIS — J181 Lobar pneumonia, unspecified organism: Secondary | ICD-10-CM

## 2020-09-01 DIAGNOSIS — J9601 Acute respiratory failure with hypoxia: Secondary | ICD-10-CM | POA: Diagnosis present

## 2020-09-01 DIAGNOSIS — Z6836 Body mass index (BMI) 36.0-36.9, adult: Secondary | ICD-10-CM

## 2020-09-01 DIAGNOSIS — E46 Unspecified protein-calorie malnutrition: Secondary | ICD-10-CM | POA: Diagnosis present

## 2020-09-01 DIAGNOSIS — B370 Candidal stomatitis: Secondary | ICD-10-CM | POA: Diagnosis present

## 2020-09-01 DIAGNOSIS — Z9682 Presence of neurostimulator: Secondary | ICD-10-CM

## 2020-09-01 DIAGNOSIS — F418 Other specified anxiety disorders: Secondary | ICD-10-CM | POA: Diagnosis present

## 2020-09-01 DIAGNOSIS — G4700 Insomnia, unspecified: Secondary | ICD-10-CM | POA: Diagnosis present

## 2020-09-01 DIAGNOSIS — Z87891 Personal history of nicotine dependence: Secondary | ICD-10-CM

## 2020-09-01 DIAGNOSIS — R0902 Hypoxemia: Secondary | ICD-10-CM

## 2020-09-01 DIAGNOSIS — Z79899 Other long term (current) drug therapy: Secondary | ICD-10-CM

## 2020-09-01 DIAGNOSIS — K219 Gastro-esophageal reflux disease without esophagitis: Secondary | ICD-10-CM | POA: Diagnosis present

## 2020-09-01 DIAGNOSIS — Z9049 Acquired absence of other specified parts of digestive tract: Secondary | ICD-10-CM

## 2020-09-01 LAB — CBC WITH DIFFERENTIAL/PLATELET
Abs Immature Granulocytes: 0.11 10*3/uL — ABNORMAL HIGH (ref 0.00–0.07)
Basophils Absolute: 0 10*3/uL (ref 0.0–0.1)
Basophils Relative: 0 %
Eosinophils Absolute: 0 10*3/uL (ref 0.0–0.5)
Eosinophils Relative: 0 %
HCT: 39.7 % (ref 39.0–52.0)
Hemoglobin: 13.2 g/dL (ref 13.0–17.0)
Immature Granulocytes: 1 %
Lymphocytes Relative: 10 %
Lymphs Abs: 0.8 10*3/uL (ref 0.7–4.0)
MCH: 28.6 pg (ref 26.0–34.0)
MCHC: 33.2 g/dL (ref 30.0–36.0)
MCV: 85.9 fL (ref 80.0–100.0)
Monocytes Absolute: 0.4 10*3/uL (ref 0.1–1.0)
Monocytes Relative: 5 %
Neutro Abs: 6.7 10*3/uL (ref 1.7–7.7)
Neutrophils Relative %: 84 %
Platelets: 177 10*3/uL (ref 150–400)
RBC: 4.62 MIL/uL (ref 4.22–5.81)
RDW: 12.9 % (ref 11.5–15.5)
WBC: 7.9 10*3/uL (ref 4.0–10.5)
nRBC: 0 % (ref 0.0–0.2)

## 2020-09-01 LAB — COMPREHENSIVE METABOLIC PANEL
ALT: 20 U/L (ref 0–44)
AST: 39 U/L (ref 15–41)
Albumin: 3.1 g/dL — ABNORMAL LOW (ref 3.5–5.0)
Alkaline Phosphatase: 38 U/L (ref 38–126)
Anion gap: 10 (ref 5–15)
BUN: 16 mg/dL (ref 6–20)
CO2: 21 mmol/L — ABNORMAL LOW (ref 22–32)
Calcium: 7.9 mg/dL — ABNORMAL LOW (ref 8.9–10.3)
Chloride: 95 mmol/L — ABNORMAL LOW (ref 98–111)
Creatinine, Ser: 1.01 mg/dL (ref 0.61–1.24)
GFR, Estimated: >60 mL/min (ref >60)
Glucose, Bld: 110 mg/dL — ABNORMAL HIGH (ref 70–99)
Potassium: 3.8 mmol/L (ref 3.5–5.1)
Sodium: 126 mmol/L — ABNORMAL LOW (ref 135–145)
Total Bilirubin: 0.6 mg/dL (ref 0.3–1.2)
Total Protein: 6.5 g/dL (ref 6.5–8.1)

## 2020-09-01 LAB — TRIGLYCERIDES: Triglycerides: 57 mg/dL (ref <150)

## 2020-09-01 LAB — RESP PANEL BY RT-PCR (FLU A&B, COVID) ARPGX2
Influenza A by PCR: NEGATIVE
Influenza B by PCR: NEGATIVE
SARS Coronavirus 2 by RT PCR: POSITIVE — AB

## 2020-09-01 LAB — PROCALCITONIN: Procalcitonin: 0.31 ng/mL

## 2020-09-01 LAB — D-DIMER, QUANTITATIVE: D-Dimer, Quant: 1.35 ug/mL-FEU — ABNORMAL HIGH (ref 0.00–0.50)

## 2020-09-01 LAB — FIBRINOGEN: Fibrinogen: 589 mg/dL — ABNORMAL HIGH (ref 210–475)

## 2020-09-01 LAB — C-REACTIVE PROTEIN: CRP: 13.4 mg/dL — ABNORMAL HIGH (ref <1.0)

## 2020-09-01 LAB — LACTATE DEHYDROGENASE: LDH: 288 U/L — ABNORMAL HIGH (ref 98–192)

## 2020-09-01 LAB — VITAMIN D 25 HYDROXY (VIT D DEFICIENCY, FRACTURES): Vit D, 25-Hydroxy: 66.12 ng/mL (ref 30–100)

## 2020-09-01 LAB — LACTIC ACID, PLASMA: Lactic Acid, Venous: 0.7 mmol/L (ref 0.5–1.9)

## 2020-09-01 LAB — FERRITIN: Ferritin: 507 ng/mL — ABNORMAL HIGH (ref 24–336)

## 2020-09-01 MED ORDER — ZINC SULFATE 220 (50 ZN) MG PO CAPS
220.0000 mg | ORAL_CAPSULE | Freq: Every day | ORAL | Status: DC
  Administered 2020-09-02 – 2020-09-05 (×4): 220 mg via ORAL
  Filled 2020-09-01 (×4): qty 1

## 2020-09-01 MED ORDER — SODIUM CHLORIDE 0.9 % IV SOLN
1.0000 g | Freq: Once | INTRAVENOUS | Status: AC
  Administered 2020-09-01: 1 g via INTRAVENOUS
  Filled 2020-09-01: qty 10

## 2020-09-01 MED ORDER — DULOXETINE HCL 60 MG PO CPEP
60.0000 mg | ORAL_CAPSULE | Freq: Every day | ORAL | Status: DC
  Administered 2020-09-01 – 2020-09-05 (×5): 60 mg via ORAL
  Filled 2020-09-01 (×5): qty 1

## 2020-09-01 MED ORDER — ONDANSETRON HCL 4 MG PO TABS: 4.0000 mg | ORAL_TABLET | Freq: Four times a day (QID) | ORAL | Status: DC | PRN

## 2020-09-01 MED ORDER — SODIUM CHLORIDE 0.9 % IV SOLN
2.0000 g | INTRAVENOUS | Status: DC
  Administered 2020-09-02 – 2020-09-04 (×3): 2 g via INTRAVENOUS
  Filled 2020-09-01 (×3): qty 20

## 2020-09-01 MED ORDER — DIPHENHYDRAMINE HCL 50 MG/ML IJ SOLN
50.0000 mg | Freq: Once | INTRAMUSCULAR | Status: AC
  Administered 2020-09-01: 50 mg via INTRAVENOUS
  Filled 2020-09-01: qty 1

## 2020-09-01 MED ORDER — ASCORBIC ACID 500 MG PO TABS
500.0000 mg | ORAL_TABLET | Freq: Every day | ORAL | Status: DC
  Administered 2020-09-02 – 2020-09-05 (×4): 500 mg via ORAL
  Filled 2020-09-01 (×4): qty 1

## 2020-09-01 MED ORDER — METHYLPREDNISOLONE SODIUM SUCC 125 MG IJ SOLR
60.0000 mg | Freq: Two times a day (BID) | INTRAMUSCULAR | Status: DC
  Administered 2020-09-01 – 2020-09-05 (×8): 60 mg via INTRAVENOUS
  Filled 2020-09-01 (×8): qty 2

## 2020-09-01 MED ORDER — DEXAMETHASONE SODIUM PHOSPHATE 10 MG/ML IJ SOLN
10.0000 mg | Freq: Once | INTRAMUSCULAR | Status: AC
  Administered 2020-09-01: 10 mg via INTRAVENOUS
  Filled 2020-09-01: qty 1

## 2020-09-01 MED ORDER — BUSPIRONE HCL 5 MG PO TABS
20.0000 mg | ORAL_TABLET | Freq: Three times a day (TID) | ORAL | Status: DC
Start: 2020-09-01 — End: 2020-09-05
  Administered 2020-09-01 – 2020-09-05 (×12): 20 mg via ORAL
  Filled 2020-09-01 (×12): qty 4

## 2020-09-01 MED ORDER — ONDANSETRON HCL 4 MG/2ML IJ SOLN
4.0000 mg | Freq: Four times a day (QID) | INTRAMUSCULAR | Status: DC | PRN
  Administered 2020-09-01: 4 mg via INTRAVENOUS
  Filled 2020-09-01: qty 2

## 2020-09-01 MED ORDER — ACETAMINOPHEN 650 MG RE SUPP: 650.0000 mg | Freq: Four times a day (QID) | RECTAL | Status: DC | PRN

## 2020-09-01 MED ORDER — SODIUM CHLORIDE 0.9 % IV SOLN
1.0000 g | INTRAVENOUS | Status: DC
  Administered 2020-09-01: 1 g via INTRAVENOUS
  Filled 2020-09-01: qty 10

## 2020-09-01 MED ORDER — SODIUM CHLORIDE 0.9 % IV SOLN
500.0000 mg | INTRAVENOUS | Status: DC
  Administered 2020-09-01 – 2020-09-04 (×4): 500 mg via INTRAVENOUS
  Filled 2020-09-01 (×4): qty 500

## 2020-09-01 MED ORDER — ACETAMINOPHEN 325 MG PO TABS
650.0000 mg | ORAL_TABLET | Freq: Four times a day (QID) | ORAL | Status: DC | PRN
  Administered 2020-09-01: 650 mg via ORAL
  Filled 2020-09-01 (×2): qty 2

## 2020-09-01 NOTE — ED Provider Notes (Signed)
Associated Order(s): .Critical Care  Formatting of this note is different from the original.  Images from the original note were not included.    [facility] EMERGENCY DEPARTMENT  Provider Note    CSN: [id number]  Arrival date & time: 09/01/20  0036     Time seen 1:10 AM    History  Chief complaint shortness of breath    Jeremy Park is a 55 y.o. male.    HPI  Patient states he started feeling ill 9 days ago.  He has had cough with mild green sputum production, sore throat, and the past few days he has had central chest pain when he coughs and at other times.  However he then states the pain has been there constantly several days and is sharp and burning.  He has been short of breath for 3 days.  He states his pulse ox has been 95 to 96% but will drop down to 88%.  He states today however he had trouble keep getting his pulse ox to go over 90%.  He states he had fever in the beginning but not now.  He states he had nausea and vomiting 3 to 4 days ago.  He had diarrhea 3 or 4 episodes a day but the last episode was yesterday morning, January 7.  He has not lost his sense of taste or smell or had rhinorrhea.  He states he had a negative test for COVID on December 30, but was positive of December 31 at his PCP office.  He states he was prescribed Paxlovid,, however when he read the information sheet he took it back and said he was not going to take it because of all of the potential side effects.  He also states that he WILL REFUSE REMDESIVIR.  EMS reports his pulse ox on room air was 87%.    Patient did not take any COVID-vaccine    PCP Richardean Chimera, MD      Past Medical History:   Diagnosis Date   ? Anxiety    ? Depression    ? Hypertension      Patient Active Problem List    Diagnosis Date Noted   ? Subarachnoid hemorrhage (HCC) 12/03/2018     Past Surgical History:   Procedure Laterality Date   ? BACK SURGERY     ? CHOLECYSTECTOMY     ? HAND SURGERY     ? IR ANGIO EXTERNAL CAROTID SEL EXT CAROTID BILAT MOD SED   12/05/2018   ? IR ANGIO INTRA EXTRACRAN SEL INTERNAL CAROTID BILAT MOD SED  12/05/2018   ? IR ANGIO VERTEBRAL SEL VERTEBRAL UNI R MOD SED  12/05/2018   ? SPINAL CORD STIMULATOR IMPLANT         History reviewed. No pertinent family history.    Social History     Tobacco Use   ? Smoking status: Former Smoker     Quit date: 08/24/1986     Years since quitting: 34.0   ? Smokeless tobacco: Never Used   Substance Use Topics   ? Alcohol use: No   ? Drug use: No     Home Medications  Prior to Admission medications    Medication Sig Start Date End Date Taking? Authorizing Provider   busPIRone (BUSPAR) 10 MG tablet Take 20 mg by mouth 3 (three) times daily.     [provider]   cyclobenzaprine (FLEXERIL) 10 MG tablet Take 10 mg by mouth daily.  [provider]   DULoxetine (CYMBALTA) 60 MG capsule Take 60 mg by mouth daily.     [provider]   ibuprofen (ADVIL,MOTRIN) 200 MG tablet Take 200 mg by mouth every 6 (six) hours as needed for mild pain.    [provider]   ketoprofen (ORUDIS) 75 MG capsule Take 75 mg by mouth every 8 (eight) hours as needed for mild pain.     [provider]   lisinopril (PRINIVIL,ZESTRIL) 10 MG tablet Take 10 mg by mouth daily.     [provider]   Multiple Vitamin (MULTIVITAMIN) tablet Take 1 tablet by mouth daily.    [provider]   omeprazole (PRILOSEC) 20 MG capsule Take 20 mg by mouth daily.    [provider]   OVER THE COUNTER MEDICATION Take by mouth See admin instructions. CBD Oil - 750 mg by mouth twice daily    [provider]   vitamin B-12 (CYANOCOBALAMIN) 500 MCG tablet Take 500 mcg by mouth daily.    [provider]     Allergies     Patient has no allergy information on record.    Review of Systems    Review of Systems   All other systems reviewed and are negative.    Physical Exam  Updated Vital Signs  BP (!) 145/89   Pulse 96   Temp (!) 100.7 F (38.2 C) (Rectal)   Resp (!) 30   Ht  5\' 11"  (1.803 m)   Wt 119 kg   SpO2 93%   BMI 36.58 kg/m     Physical Exam  Vitals and nursing note reviewed.   Constitutional:       General: He is in acute distress.      Appearance: Normal appearance. He is obese. He is ill-appearing. He is not toxic-appearing.   HENT:      Head: Normocephalic and atraumatic.      Right Ear: External ear normal.      Left Ear: External ear normal.   Eyes:      Extraocular Movements: Extraocular movements intact.      Conjunctiva/sclera: Conjunctivae normal.      Pupils: Pupils are equal, round, and reactive to light.   Cardiovascular:      Rate and Rhythm: Normal rate and regular rhythm.      Pulses: Normal pulses.      Heart sounds: Normal heart sounds.   Pulmonary:      Effort: Tachypnea present.      Breath sounds: Examination of the right-lower field reveals rales. Examination of the left-lower field reveals rales. Decreased breath sounds and rales present. No wheezing.      Comments: Coughs frequently  Musculoskeletal:         General: Normal range of motion.      Cervical back: Normal range of motion.   Skin:     General: Skin is warm and dry.   Neurological:      General: No focal deficit present.      Mental Status: He is alert and oriented to person, place, and time.      Cranial Nerves: No cranial nerve deficit.      Comments: Patient was too weak to sit up, the radiology tech and I had to help him set up   Psychiatric:         Mood and Affect: Affect is flat.         Speech: Speech normal.  Behavior: Behavior normal. Behavior is cooperative.     ED Results / Procedures / Treatments    Labs  (all labs ordered are listed, but only abnormal results are displayed)  Results for orders placed or performed during the hospital encounter of 09/01/20   Lactic acid, plasma   Result Value Ref Range    Lactic Acid, Venous 0.7 0.5 - 1.9 mmol/L   CBC WITH DIFFERENTIAL   Result Value Ref Range    WBC 7.9 4.0 - 10.5 K/uL    RBC 4.62 4.22 - 5.81 MIL/uL    Hemoglobin 13.2 13.0  - 17.0 g/dL    HCT 96.0 45.4 - 09.8 %    MCV 85.9 80.0 - 100.0 fL    MCH 28.6 26.0 - 34.0 pg    MCHC 33.2 30.0 - 36.0 g/dL    RDW 11.9 14.7 - 82.9 %    Platelets 177 150 - 400 K/uL    nRBC 0.0 0.0 - 0.2 %    Neutrophils Relative % 84 %    Neutro Abs 6.7 1.7 - 7.7 K/uL    Lymphocytes Relative 10 %    Lymphs Abs 0.8 0.7 - 4.0 K/uL    Monocytes Relative 5 %    Monocytes Absolute 0.4 0.1 - 1.0 K/uL    Eosinophils Relative 0 %    Eosinophils Absolute 0.0 0.0 - 0.5 K/uL    Basophils Relative 0 %    Basophils Absolute 0.0 0.0 - 0.1 K/uL    Immature Granulocytes 1 %    Abs Immature Granulocytes 0.11 (H) 0.00 - 0.07 K/uL   Comprehensive metabolic panel   Result Value Ref Range    Sodium 126 (L) 135 - 145 mmol/L    Potassium 3.8 3.5 - 5.1 mmol/L    Chloride 95 (L) 98 - 111 mmol/L    CO2 21 (L) 22 - 32 mmol/L    Glucose, Bld 110 (H) 70 - 99 mg/dL    BUN 16 6 - 20 mg/dL    Creatinine, Ser 5.62 0.61 - 1.24 mg/dL    Calcium 7.9 (L) 8.9 - 10.3 mg/dL    Total Protein 6.5 6.5 - 8.1 g/dL    Albumin 3.1 (L) 3.5 - 5.0 g/dL    AST 39 15 - 41 U/L    ALT 20 0 - 44 U/L    Alkaline Phosphatase 38 38 - 126 U/L    Total Bilirubin 0.6 0.3 - 1.2 mg/dL    GFR, Estimated >13 >08 mL/min    Anion gap 10 5 - 15   D-dimer, quantitative   Result Value Ref Range    D-Dimer, Quant 1.35 (H) 0.00 - 0.50 ug/mL-FEU   Lactate dehydrogenase   Result Value Ref Range    LDH 288 (H) 98 - 192 U/L   Triglycerides   Result Value Ref Range    Triglycerides 57 <150 mg/dL   Fibrinogen   Result Value Ref Range    Fibrinogen 589 (H) 210 - 475 mg/dL     Laboratory interpretation all normal except hyponatremia, elevated informatory markers consistent with COVID    EKG  None     ED ECG REPORT     Date: 09/01/2020   Rate: 97   Rhythm: normal sinus rhythm   QRS Axis: normal   Intervals: normal   ST/T Wave abnormalities: normal   Conduction Disutrbances:Low voltage chest leads   Narrative Interpretation:    Old EKG Reviewed: none available    I have personally  reviewed the  EKG tracing and agree with the computerized printout as noted.    Radiology  DG Chest Port 1 View    Result Date: 09/01/2020  CLINICAL DATA:  Initial evaluation for acute approximately, probable COVID. EXAM: PORTABLE CHEST 1 VIEW COMPARISON:  Prior radiograph from 12/03/2018. FINDINGS: Mild cardiomegaly.  Mediastinal silhouette within normal limits. Lungs mildly hypoinflated. Multifocal parenchymal predominantly peripheral opacity seen involving the mid and lower lungs, concerning for multifocal pneumonia, likely COVID pneumonia. No pulmonary edema or pleural effusion. No pneumothorax. No acute osseous finding. IMPRESSION: Multifocal parenchymal opacities involving the mid and lower lungs, concerning for multifocal pneumonia. COVID pneumonia is suspected. Electronically Signed   By: Rise Mu M.D.   On: 09/01/2020 01:59     Procedures  .Critical Care  Performed by: Devoria Albe, MD  Authorized by: Devoria Albe, MD     Critical care provider statement:     Critical care time (minutes):  38    Critical care was necessary to treat or prevent imminent or life-threatening deterioration of the following conditions:  Respiratory failure    Critical care was time spent personally by me on the following activities:  Examination of patient, obtaining history from patient or surrogate, ordering and review of laboratory studies, ordering and review of radiographic studies and pulse oximetry    Care discussed with: admitting provider       (including critical care time)    Medications Ordered in ED  Medications   cefTRIAXone (ROCEPHIN) 1 g in sodium chloride 0.9 % 100 mL IVPB (1 g Intravenous New Bag/Given 09/01/20 0255)   azithromycin (ZITHROMAX) 500 mg in sodium chloride 0.9 % 250 mL IVPB (500 mg Intravenous New Bag/Given 09/01/20 0256)   dexamethasone (DECADRON) injection 10 mg (10 mg Intravenous Given 09/01/20 0252)     ED Course   I have reviewed the triage vital signs and the nursing notes.    Pertinent labs & imaging  results that were available during my care of the patient were reviewed by me and considered in my medical decision making (see chart for details).      MDM Rules/Calculators/A&P    I had reviewed patient's x-ray on the portable machine and he has a couple areas of consolidation on the right side.  He was started on antibiotics, steroids, and due to him refusing any other treatments he is at the end of the window for monoclonal antibodies.   He needs to be admitted however he is refusing any other treatment that would be used other than the steroids.    2:36 AM I discussed patient with Dr. Robb Matar, hospitalist.  We decided to wait for the monoclonal antibodies since he is late in the course of his illness.  I will call him back once his labs have resulted for admission.    I called patient and informed him that it was too late for [place] to give him the monoclonal antibodies.    Dr. Robb Matar was notified patient's labs have resulted.    Jeremy Park was evaluated in Emergency Department on 09/01/2020 for the symptoms described in the history of present illness. He was evaluated in the context of the global COVID-19 pandemic, which necessitated consideration that the patient might be at risk for infection with the SARS-CoV-2 virus that causes COVID-19. Institutional protocols and algorithms that pertain to the evaluation of patients at risk for COVID-19 are in a state of rapid change based on information released by regulatory bodies including the  CDC and federal and [employer]. These policies and algorithms were followed during the patient's care in the ED.    Final Clinical Impression(s) / ED Diagnoses  Final diagnoses:   Pneumonia due to COVID-19 virus   Hypoxia     Rx / DC Orders    Plan admission    Devoria Albe, MD, Concha Pyo, MD  09/01/20 0530    Electronically signed by Devoria Albe, MD at 09/01/2020  5:30 AM EST

## 2020-09-01 NOTE — Progress Notes (Signed)
Formatting of this note might be different from the original.  Patient seen and examined.  Admitted after midnight secondary to shortness of breath and general malaise.  Found with positive COVID-19 infection.  Patient oxygen saturation on room air 87%; 2-3 L keeping O2 sat at rest 94 to 96%.  Patient CRP 13.4 and ferritin level 507.  CXR demonstrating multifocal parenchymal opacities in the mid and lower lungs.  Patient's symptoms have been present for the last 9 days and progressively worsening.  Patient has declined the use of remdesivir.  Started on IV steroids.  Please Refer to H&P written by Dr. Robb Matar for further info/details on admission.    Plan:  -Patient has declined the use of remdesivir  -Okay to the use of Solu-Medrol  -Given elevated procalcitonin level will provide antibiotic coverage for the possibility of superimposed bacterial infection.  -Continue as needed bronchodilators, antitussive medication and supportive care  -Patient instructed on proning position, and to use incentive spirometer and flutter valve.  -Continue Vitamin C, zinc and follow inflammatory markers.    Vassie Loll MD  [phone]      Electronically signed by Vassie Loll, MD at 09/01/2020  3:18 PM EST

## 2020-09-01 NOTE — ED Triage Notes (Signed)
Formatting of this note might be different from the original.  Pt brought in by EMS for c/o SOB. Pt Covid + x 10 days. Wife currently pt in this ED for same. Per EMS, pt with O2 sats of 87-92 on room air so pt placed on O2 @ 2L and sats up to 94-96%.  Electronically signed by Wonda Horner, RN at 09/01/2020 12:50 AM EST

## 2020-09-01 NOTE — H&P (Signed)
Formatting of this note is different from the original.  Images from the original note were not included.    History and Physical     Jeremy Park ZOX:[id number] DOB: [birth year] DOA: 09/01/2020    PCP: Richardean Chimera, MD     Patient coming from: Home.     I have personally briefly reviewed patient's old medical records in [facility]    Chief Complaint: Shortness of breath.    HPI: Jeremy Park is a 55 y.o. Park with medical history significant of anxiety, depression, hypertension, class II obesity, history of back surgery who is coming to the emergency department with a history of 9 days of progressively worse viral symptoms which include sore throat, cough of occasionally productive greenish sputum, fatigue, malaise, pleuritic chest pain, abdominal pain, multiple episodes of diarrhea, emesis, decreased appetite and some difficulty sleeping.  He saw his PCP several days ago and was prescribed Paxlovid, but after seeing the medication information sheet he took it back to the pharmacy and did not take it because of potential side effects.  He denies lower extremity edema, dysuria, frequency or hematuria.  No polyuria, polydipsia, polyphagia or blurred vision.    ED Course: Initial vital signs were temperature 100.5 F, pulse 104, respirations 36, BP 145/83 mmHg O2 sat was 87 to 92% on room air.  The patient was placed on nasal cannula O2 at 2 LPM and his oxygen saturation increased to 94 to 96%.  The patient received dexamethasone, ceftriaxone and azithromycin.  He declined remdesivir and interferon treatment.    Labwork: CBC was normal.  Fibrinogen was 589 mg/dL.  D-dimer was 1.35 mcg/mL.  Lactic acid was unremarkable.  Sodium 126, potassium 3.8,Chloride 95 and CO2 21 mmol/L.  Renal function was normal.  Glucose 110 and calcium 7.9 mg/dL.  LFTs are normal, except for an albumin of 3.1 g/dL.CRP was 13.4 mg/dL and ferritin 811 ng/mL.    Imaging: A 1 view chest radiograph showed multifocal parenchymal opacities in  the mid and lower lungs, concerning for multifocal pneumonia.  Please see image and full radiology report for further detail.    Review of Systems: As per HPI otherwise all other systems reviewed and are negative.    Past Medical History:   Diagnosis Date   ? Anxiety    ? Depression    ? Hypertension      Past Surgical History:   Procedure Laterality Date   ? BACK SURGERY     ? CHOLECYSTECTOMY     ? HAND SURGERY     ? IR ANGIO EXTERNAL CAROTID SEL EXT CAROTID BILAT MOD SED  12/05/2018   ? IR ANGIO INTRA EXTRACRAN SEL INTERNAL CAROTID BILAT MOD SED  12/05/2018   ? IR ANGIO VERTEBRAL SEL VERTEBRAL UNI R MOD SED  12/05/2018   ? SPINAL CORD STIMULATOR IMPLANT       Social History   reports that he quit smoking about 34 years ago. He has never used smokeless tobacco. He reports that he does not drink alcohol and does not use drugs.    Not on File    Family History   Problem Relation Age of Onset   ? Diabetes Mellitus II Father         B/L lower extremities amputation.     Prior to Admission medications    Medication Sig Start Date End Date Taking? Authorizing Provider   busPIRone (BUSPAR) 10 MG tablet Take 20 mg by mouth 3 (three) times  daily.     [provider]   cyclobenzaprine (FLEXERIL) 10 MG tablet Take 10 mg by mouth daily.     [provider]   DULoxetine (CYMBALTA) 60 MG capsule Take 60 mg by mouth daily.     [provider]   ibuprofen (ADVIL,MOTRIN) 200 MG tablet Take 200 mg by mouth every 6 (six) hours as needed for mild pain.    [provider]   ketoprofen (ORUDIS) 75 MG capsule Take 75 mg by mouth every 8 (eight) hours as needed for mild pain.     [provider]   lisinopril (PRINIVIL,ZESTRIL) 10 MG tablet Take 10 mg by mouth daily.     [provider]   Multiple Vitamin (MULTIVITAMIN) tablet Take 1 tablet by mouth daily.    [provider]   omeprazole (PRILOSEC) 20 MG capsule Take 20 mg by mouth daily.    [provider]   OVER THE  COUNTER MEDICATION Take by mouth See admin instructions. CBD Oil - 750 mg by mouth twice daily    [provider]   vitamin B-12 (CYANOCOBALAMIN) 500 MCG tablet Take 500 mcg by mouth daily.    [provider]     Physical Exam:  Vitals:    09/01/20 0300 09/01/20 0400 09/01/20 0418 09/01/20 0430   BP: (!) 145/89 (!) 147/84  140/87   Pulse: 96 99 98 96   Resp: (!) 30 (!) 30 (!) 32 (!) 31   Temp:       TempSrc:       SpO2: 93% (!) 89% 94% 95%   Weight:       Height:         Constitutional: Looks acutely ill.  Eyes: PERRL, lids and conjunctivae mildly injected.  ENMT: Nasal cannula in place.  Mucous membranes are mildly dry.  Posterior pharynx clear of any exudate or lesions.  Neck: normal, supple, no masses, no thyromegaly  Respiratory: Tachypneic in the high 20s and low 30s with bilateral scattered crackles.  The patient is unable to speak in full sentences.  No accessory muscle use.   Cardiovascular: Regular rate and rhythm, no murmurs / rubs / gallops. No extremity edema. 2+ pedal pulses. No carotid bruits.   Abdomen: Obese, nondistended.  Bowel sounds positive.  Positive abdominal tenderness at the origin and insertions of abdominal muscles secondary to frequent cough.  No masses palpated. No hepatosplenomegaly. Bowel sounds positive.   Musculoskeletal: no clubbing / cyanosis.  Good ROM, no contractures. Normal muscle tone.   Skin: no significant rashes, lesions, ulcers on very limited dermatological examination.  Neurologic: CN 2-12 grossly intact. Sensation intact, DTR normal. Strength 5/5 in all 4.   Psychiatric: Normal judgment and insight. Alert and oriented x 3. Normal mood.     Labs on Admission: I have personally reviewed following labs and imaging studies    CBC:  Recent Labs   Lab 09/01/20  0230   WBC 7.9   NEUTROABS 6.7   HGB 13.2   HCT 39.7   MCV 85.9   PLT 177     Basic Metabolic Panel:  Recent Labs   Lab 09/01/20  0230   NA 126*   K 3.8   CL 95*   CO2 21*   GLUCOSE 110*   BUN 16    CREATININE 1.01   CALCIUM 7.9*     GFR:  Estimated Creatinine Clearance: 107.2 mL/min (by C-G formula based on SCr of 1.01 mg/dL).  Liver Function Tests:  Recent Labs   Lab 09/01/20  0230   AST 39   ALT 20   ALKPHOS 38   BILITOT 0.6   PROT 6.5   ALBUMIN 3.1*     Radiological Exams on Admission:  DG Chest Port 1 View    Result Date: 09/01/2020  CLINICAL DATA:  Initial evaluation for acute approximately, probable COVID. EXAM: PORTABLE CHEST 1 VIEW COMPARISON:  Prior radiograph from 12/03/2018. FINDINGS: Mild cardiomegaly.  Mediastinal silhouette within normal limits. Lungs mildly hypoinflated. Multifocal parenchymal predominantly peripheral opacity seen involving the mid and lower lungs, concerning for multifocal pneumonia, likely COVID pneumonia. No pulmonary edema or pleural effusion. No pneumothorax. No acute osseous finding. IMPRESSION: Multifocal parenchymal opacities involving the mid and lower lungs, concerning for multifocal pneumonia. COVID pneumonia is suspected. Electronically Signed   By: Rise Mu M.D.   On: 09/01/2020 01:59     EKG: Independently reviewed.   Vent. rate 97 BPM  PR interval * ms  QRS duration 89 ms  QT/QTc 338/430 ms  P-R-T axes 49 63 50  Sinus rhythm  Low voltage, precordial leads  When compared with ECG of 12/03/2018    Assessment/Plan  Principal Problem:    Pneumonia due to COVID-19 virus  Continue supplemental oxygen.  Incentive spirometry and flutter valve.  Bronchodilators as needed.  Analgesics as needed.  Antiemetics as needed.  Antitussives as needed.  Continue ceftriaxone and azithromycin.  Dexamethasone 6 mg IVP daily.  Remdesivir declined by the patient.  High CRP, suggested biological agent.  The patient declined treatment with interferon.  Monitor CBC, CMP and inflammatory markers.    Active Problems:    Hyponatremia  Secondary to GI losses.  Hypotonic fluids oral rehydration.  Follow sodium level.      Protein calorie malnutrition (HCC)  Heart healthy  diet.  Protein supplementation.      Hypertension  Monitor blood pressure.      Anxiety with depression  Resume antidepressant once med rec performed.    DVT prophylaxis:           SCDs.  History of SAH.  Code Status:   Full code.  Family Communication:  Disposition Plan:    Patient is from:  Home.   Anticipated DC to:  Home.   Anticipated DC date:  09/04/2019.   Anticipated DC barriers: Clinical status.   Consults called:  Admission status:  Observation/telemetry.     Severity of Illness: Very high on a patient who has Covid pneumonia with an elevated CRP level who is declining treatment with antiviral and biological agents.    Bobette Mo MD  [facility]    How to contact the [facility] Attending or Consulting provider 7A - 7P or covering provider during after hours 7P -7A, for this patient?     1. Check the care team in [facility] and look for a) attending/consulting [facility] provider listed and b) the [facility] team listed  2. Log into [email or web address] and use [facility]'s universal password to access. If you do not have the password, please contact the hospital operator.  3. Locate the [facility] provider you are looking for under [facility] and page to a number that you can be directly reached.  4. If you still have difficulty reaching the provider, please page the [facility] (Director on Call) for the Hospitalists listed on amion for assistance.    09/01/2020, 5:28 AM     This document was prepared using Dragon voice recognition software and  may contain some unintended transcription errors.  Electronically signed by Bobette Mo, MD at 09/01/2020  7:19 AM EST

## 2020-09-01 NOTE — ED Triage Notes (Signed)
Pt brought in by EMS for c/o SOB. Pt Covid + x 10 days. Wife currently pt in this ED for same. Per EMS, pt with O2 sats of 87-92 on room air so pt placed on O2 @ 2L and sats up to 94-96%.

## 2020-09-01 NOTE — ED Provider Notes (Signed)
[facility] EMERGENCY DEPARTMENT Provider Note   CSN: [id number] Arrival date & time: 09/01/20  0036   Time seen 1:10 AM  History Chief complaint shortness of breath  Aaron Soto is a 55 y.o. male.  HPI Patient states he started feeling ill 9 days ago.  He has had cough with mild green sputum production, sore throat, and the past few days he has had central chest pain when he coughs and at other times.  However he then states the pain has been there constantly several days and is sharp and burning.  He has been short of breath for 3 days.  He states his pulse ox has been 95 to 96% but will drop down to 88%.  He states today however he had trouble keep getting his pulse ox to go over 90%.  He states he had fever in the beginning but not now.  He states he had nausea and vomiting 3 to 4 days ago.  He had diarrhea 3 or 4 episodes a day but the last episode was yesterday morning, January 7.  He has not lost his sense of taste or smell or had rhinorrhea.  He states he had a negative test for COVID on December 30, but was positive of December 31 at his PCP office.  He states he was prescribed Paxlovid,, however when he read the information sheet he took it back and said he was not going to take it because of all of the potential side effects.  He also states that he WILL REFUSE REMDESIVIR.  EMS reports his pulse ox on room air was 87%.  Patient did not take any COVID-vaccine  PCP Richardean Chimera, MD    Past Medical History:  Diagnosis Date  . Anxiety   . Depression   . Hypertension     Patient Active Problem List   Diagnosis Date Noted  . Subarachnoid hemorrhage (HCC) 12/03/2018    Past Surgical History:  Procedure Laterality Date  . BACK SURGERY    . CHOLECYSTECTOMY    . HAND SURGERY    . IR ANGIO EXTERNAL CAROTID SEL EXT CAROTID BILAT MOD SED  12/05/2018  . IR ANGIO INTRA EXTRACRAN SEL INTERNAL CAROTID BILAT MOD SED  12/05/2018  . IR ANGIO VERTEBRAL SEL VERTEBRAL UNI R MOD SED   12/05/2018  . SPINAL CORD STIMULATOR IMPLANT         History reviewed. No pertinent family history.  Social History   Tobacco Use  . Smoking status: Former Smoker    Quit date: 08/24/1986    Years since quitting: 34.0  . Smokeless tobacco: Never Used  Substance Use Topics  . Alcohol use: No  . Drug use: No    Home Medications Prior to Admission medications   Medication Sig Start Date End Date Taking? Authorizing Provider  busPIRone (BUSPAR) 10 MG tablet Take 20 mg by mouth 3 (three) times daily.     [provider]  cyclobenzaprine (FLEXERIL) 10 MG tablet Take 10 mg by mouth daily.     [provider]  DULoxetine (CYMBALTA) 60 MG capsule Take 60 mg by mouth daily.     [provider]  ibuprofen (ADVIL,MOTRIN) 200 MG tablet Take 200 mg by mouth every 6 (six) hours as needed for mild pain.    [provider]  ketoprofen (ORUDIS) 75 MG capsule Take 75 mg by mouth every 8 (eight) hours as needed for mild pain.     [provider]  lisinopril (  PRINIVIL,ZESTRIL) 10 MG tablet Take 10 mg by mouth daily.     [provider]  Multiple Vitamin (MULTIVITAMIN) tablet Take 1 tablet by mouth daily.    [provider]  omeprazole (PRILOSEC) 20 MG capsule Take 20 mg by mouth daily.    [provider]  OVER THE COUNTER MEDICATION Take by mouth See admin instructions. CBD Oil - 750 mg by mouth twice daily    [provider]  vitamin B-12 (CYANOCOBALAMIN) 500 MCG tablet Take 500 mcg by mouth daily.    [provider]    Allergies    Patient has no allergy information on record.  Review of Systems   Review of Systems  All other systems reviewed and are negative.   Physical Exam Updated Vital Signs BP (!) 145/89   Pulse 96   Temp (!) 100.7 F (38.2 C) (Rectal)   Resp (!) 30   Ht 5\' 11"  (1.803 m)   Wt 119 kg   SpO2 93%   BMI 36.58 kg/m   Physical Exam Vitals and nursing note reviewed.   Constitutional:      General: He is in acute distress.     Appearance: Normal appearance. He is obese. He is ill-appearing. He is not toxic-appearing.  HENT:     Head: Normocephalic and atraumatic.     Right Ear: External ear normal.     Left Ear: External ear normal.  Eyes:     Extraocular Movements: Extraocular movements intact.     Conjunctiva/sclera: Conjunctivae normal.     Pupils: Pupils are equal, round, and reactive to light.  Cardiovascular:     Rate and Rhythm: Normal rate and regular rhythm.     Pulses: Normal pulses.     Heart sounds: Normal heart sounds.  Pulmonary:     Effort: Tachypnea present.     Breath sounds: Examination of the right-lower field reveals rales. Examination of the left-lower field reveals rales. Decreased breath sounds and rales present. No wheezing.     Comments: Coughs frequently Musculoskeletal:        General: Normal range of motion.     Cervical back: Normal range of motion.  Skin:    General: Skin is warm and dry.  Neurological:     General: No focal deficit present.     Mental Status: He is alert and oriented to person, place, and time.     Cranial Nerves: No cranial nerve deficit.     Comments: Patient was too weak to sit up, the radiology tech and I had to help him set up  Psychiatric:        Mood and Affect: Affect is flat.        Speech: Speech normal.        Behavior: Behavior normal. Behavior is cooperative.     ED Results / Procedures / Treatments   Labs (all labs ordered are listed, but only abnormal results are displayed) Results for orders placed or performed during the hospital encounter of 09/01/20  Lactic acid, plasma  Result Value Ref Range   Lactic Acid, Venous 0.7 0.5 - 1.9 mmol/L  CBC WITH DIFFERENTIAL  Result Value Ref Range   WBC 7.9 4.0 - 10.5 K/uL   RBC 4.62 4.22 - 5.81 MIL/uL   Hemoglobin 13.2 13.0 - 17.0 g/dL   HCT 04.539.7 40.939.0 - 81.152.0 %   MCV 85.9 80.0 - 100.0 fL   MCH 28.6 26.0 - 34.0 pg   MCHC 33.2  30.0 -  36.0 g/dL   RDW 56.2 13.0 - 86.5 %   Platelets 177 150 - 400 K/uL   nRBC 0.0 0.0 - 0.2 %   Neutrophils Relative % 84 %   Neutro Abs 6.7 1.7 - 7.7 K/uL   Lymphocytes Relative 10 %   Lymphs Abs 0.8 0.7 - 4.0 K/uL   Monocytes Relative 5 %   Monocytes Absolute 0.4 0.1 - 1.0 K/uL   Eosinophils Relative 0 %   Eosinophils Absolute 0.0 0.0 - 0.5 K/uL   Basophils Relative 0 %   Basophils Absolute 0.0 0.0 - 0.1 K/uL   Immature Granulocytes 1 %   Abs Immature Granulocytes 0.11 (H) 0.00 - 0.07 K/uL  Comprehensive metabolic panel  Result Value Ref Range   Sodium 126 (L) 135 - 145 mmol/L   Potassium 3.8 3.5 - 5.1 mmol/L   Chloride 95 (L) 98 - 111 mmol/L   CO2 21 (L) 22 - 32 mmol/L   Glucose, Bld 110 (H) 70 - 99 mg/dL   BUN 16 6 - 20 mg/dL   Creatinine, Ser 7.84 0.61 - 1.24 mg/dL   Calcium 7.9 (L) 8.9 - 10.3 mg/dL   Total Protein 6.5 6.5 - 8.1 g/dL   Albumin 3.1 (L) 3.5 - 5.0 g/dL   AST 39 15 - 41 U/L   ALT 20 0 - 44 U/L   Alkaline Phosphatase 38 38 - 126 U/L   Total Bilirubin 0.6 0.3 - 1.2 mg/dL   GFR, Estimated >69 >62 mL/min   Anion gap 10 5 - 15  D-dimer, quantitative  Result Value Ref Range   D-Dimer, Quant 1.35 (H) 0.00 - 0.50 ug/mL-FEU  Lactate dehydrogenase  Result Value Ref Range   LDH 288 (H) 98 - 192 U/L  Triglycerides  Result Value Ref Range   Triglycerides 57 <150 mg/dL  Fibrinogen  Result Value Ref Range   Fibrinogen 589 (H) 210 - 475 mg/dL   Laboratory interpretation all normal except hyponatremia, elevated informatory markers consistent with COVID    EKG None     ED ECG REPORT   Date: 09/01/2020  Rate: 97  Rhythm: normal sinus rhythm  QRS Axis: normal  Intervals: normal  ST/T Wave abnormalities: normal  Conduction Disutrbances:Low voltage chest leads  Narrative Interpretation:   Old EKG Reviewed: none available  I have personally reviewed the EKG tracing and agree with the computerized printout as noted.     Radiology DG Chest Port 1  View  Result Date: 09/01/2020 CLINICAL DATA:  Initial evaluation for acute approximately, probable COVID. EXAM: PORTABLE CHEST 1 VIEW COMPARISON:  Prior radiograph from 12/03/2018. FINDINGS: Mild cardiomegaly.  Mediastinal silhouette within normal limits. Lungs mildly hypoinflated. Multifocal parenchymal predominantly peripheral opacity seen involving the mid and lower lungs, concerning for multifocal pneumonia, likely COVID pneumonia. No pulmonary edema or pleural effusion. No pneumothorax. No acute osseous finding. IMPRESSION: Multifocal parenchymal opacities involving the mid and lower lungs, concerning for multifocal pneumonia. COVID pneumonia is suspected. Electronically Signed   By: Rise Mu M.D.   On: 09/01/2020 01:59    Procedures .Critical Care Performed by: Devoria Albe, MD Authorized by: Devoria Albe, MD   Critical care provider statement:    Critical care time (minutes):  38   Critical care was necessary to treat or prevent imminent or life-threatening deterioration of the following conditions:  Respiratory failure   Critical care was time spent personally by me on the following activities:  Examination of patient, obtaining history from patient or surrogate, ordering  and review of laboratory studies, ordering and review of radiographic studies and pulse oximetry   Care discussed with: admitting provider     (including critical care time)  Medications Ordered in ED Medications  cefTRIAXone (ROCEPHIN) 1 g in sodium chloride 0.9 % 100 mL IVPB (1 g Intravenous New Bag/Given 09/01/20 0255)  azithromycin (ZITHROMAX) 500 mg in sodium chloride 0.9 % 250 mL IVPB (500 mg Intravenous New Bag/Given 09/01/20 0256)  dexamethasone (DECADRON) injection 10 mg (10 mg Intravenous Given 09/01/20 0252)    ED Course  I have reviewed the triage vital signs and the nursing notes.  Pertinent labs & imaging results that were available during my care of the patient were reviewed by me and considered  in my medical decision making (see chart for details).    MDM Rules/Calculators/A&P                         I had reviewed patient's x-ray on the portable machine and he has a couple areas of consolidation on the right side.  He was started on antibiotics, steroids, and due to him refusing any other treatments he is at the end of the window for monoclonal antibodies.   He needs to be admitted however he is refusing any other treatment that would be used other than the steroids.  2:36 AM I discussed patient with Dr. Robb Matar, hospitalist.  We decided to wait for the monoclonal antibodies since he is late in the course of his illness.  I will call him back once his labs have resulted for admission.  I called patient and informed him that it was too late for [place] to give him the monoclonal antibodies.  Dr. Robb Matar was notified patient's labs have resulted.  YAFET CLINE was evaluated in Emergency Department on 09/01/2020 for the symptoms described in the history of present illness. He was evaluated in the context of the global COVID-19 pandemic, which necessitated consideration that the patient might be at risk for infection with the SARS-CoV-2 virus that causes COVID-19. Institutional protocols and algorithms that pertain to the evaluation of patients at risk for COVID-19 are in a state of rapid change based on information released by regulatory bodies including the CDC and federal and state organizations. These policies and algorithms were followed during the patient's care in the ED.    Final Clinical Impression(s) / ED Diagnoses Final diagnoses:  Pneumonia due to COVID-19 virus  Hypoxia    Rx / DC Orders  Plan admission  Devoria Albe, MD, Concha Pyo, MD 09/01/20 0530

## 2020-09-01 NOTE — H&P (Signed)
History and Physical    Aaron Soto DJM:[id number] DOB: June 26, 1966 DOA: 09/01/2020  PCP: Richardean Chimera, MD   Patient coming from: Home.   I have personally briefly reviewed patient's old medical records in [facility]  Chief Complaint: Shortness of breath.  HPI: Aaron Soto is a 55 y.o. male with medical history significant of anxiety, depression, hypertension, class II obesity, history of back surgery who is coming to the emergency department with a history of 9 days of progressively worse viral symptoms which include sore throat, cough of occasionally productive greenish sputum, fatigue, malaise, pleuritic chest pain, abdominal pain, multiple episodes of diarrhea, emesis, decreased appetite and some difficulty sleeping.  He saw his PCP several days ago and was prescribed Paxlovid, but after seeing the medication information sheet he took it back to the pharmacy and did not take it because of potential side effects.  He denies lower extremity edema, dysuria, frequency or hematuria.  No polyuria, polydipsia, polyphagia or blurred vision.  ED Course: Initial vital signs were temperature 100.5 F, pulse 104, respirations 36, BP 145/83 mmHg O2 sat was 87 to 92% on room air.  The patient was placed on nasal cannula O2 at 2 LPM and his oxygen saturation increased to 94 to 96%.  The patient received dexamethasone, ceftriaxone and azithromycin.  He declined remdesivir and interferon treatment.  Labwork: CBC was normal.  Fibrinogen was 589 mg/dL.  D-dimer was 1.35 mcg/mL.  Lactic acid was unremarkable.  Sodium 126, potassium 3.8,Chloride 95 and CO2 21 mmol/L.  Renal function was normal.  Glucose 110 and calcium 7.9 mg/dL.  LFTs are normal, except for an albumin of 3.1 g/dL.CRP was 13.4 mg/dL and ferritin 222 ng/mL.  Imaging: A 1 view chest radiograph showed multifocal parenchymal opacities in the mid and lower lungs, concerning for multifocal pneumonia.  Please see image and full radiology report  for further detail.  Review of Systems: As per HPI otherwise all other systems reviewed and are negative.  Past Medical History:  Diagnosis Date   Anxiety    Depression    Hypertension    Past Surgical History:  Procedure Laterality Date   BACK SURGERY     CHOLECYSTECTOMY     HAND SURGERY     IR ANGIO EXTERNAL CAROTID SEL EXT CAROTID BILAT MOD SED  12/05/2018   IR ANGIO INTRA EXTRACRAN SEL INTERNAL CAROTID BILAT MOD SED  12/05/2018   IR ANGIO VERTEBRAL SEL VERTEBRAL UNI R MOD SED  12/05/2018   SPINAL CORD STIMULATOR IMPLANT     Social History  reports that he quit smoking about 34 years ago. He has never used smokeless tobacco. He reports that he does not drink alcohol and does not use drugs.  Not on File  Family History  Problem Relation Age of Onset   Diabetes Mellitus II Father        B/L lower extremities amputation.   Prior to Admission medications   Medication Sig Start Date End Date Taking? Authorizing Provider  busPIRone (BUSPAR) 10 MG tablet Take 20 mg by mouth 3 (three) times daily.     [provider]  cyclobenzaprine (FLEXERIL) 10 MG tablet Take 10 mg by mouth daily.     [provider]  DULoxetine (CYMBALTA) 60 MG capsule Take 60 mg by mouth daily.     [provider]  ibuprofen (ADVIL,MOTRIN) 200 MG tablet Take 200 mg by mouth every 6 (six) hours as needed for mild pain.    [provider]  ketoprofen (ORUDIS) 75 MG capsule Take 75 mg by mouth every 8 (eight) hours as needed for mild pain.     [provider]  lisinopril (PRINIVIL,ZESTRIL) 10 MG tablet Take 10 mg by mouth daily.     [provider]  Multiple Vitamin (MULTIVITAMIN) tablet Take 1 tablet by mouth daily.    [provider]  omeprazole (PRILOSEC) 20 MG capsule Take 20 mg by mouth daily.    [provider]  OVER THE COUNTER MEDICATION Take by mouth See admin instructions. CBD Oil - 750 mg by mouth twice daily    [provider]  vitamin B-12 (CYANOCOBALAMIN) 500 MCG tablet Take 500 mcg by mouth daily.    [provider]   Physical Exam: Vitals:   09/01/20 0300 09/01/20 0400 09/01/20 0418 09/01/20 0430  BP: (!) 145/89 (!) 147/84  140/87  Pulse: 96 99 98 96  Resp: (!) 30 (!) 30 (!) 32 (!) 31  Temp:      TempSrc:      SpO2: 93% (!) 89% 94% 95%  Weight:      Height:       Constitutional: Looks acutely ill. Eyes: PERRL, lids and conjunctivae mildly injected. ENMT: Nasal cannula in place.  Mucous membranes are mildly dry.  Posterior pharynx clear of any exudate or lesions. Neck: normal, supple, no masses, no thyromegaly Respiratory: Tachypneic in the high 20s and low 30s with bilateral scattered crackles.  The patient is unable to speak in full sentences.  No accessory muscle use.  Cardiovascular: Regular rate and rhythm, no murmurs / rubs / gallops. No extremity edema. 2+ pedal pulses. No carotid bruits.  Abdomen: Obese, nondistended.  Bowel sounds positive.  Positive abdominal tenderness at the origin and insertions of abdominal muscles secondary to frequent cough.  No masses palpated. No hepatosplenomegaly. Bowel sounds positive.  Musculoskeletal: no clubbing / cyanosis.  Good ROM, no contractures. Normal muscle tone.  Skin: no significant rashes, lesions, ulcers on very limited dermatological examination. Neurologic: CN 2-12 grossly intact. Sensation intact, DTR normal. Strength 5/5 in all 4.  Psychiatric: Normal judgment and insight. Alert and oriented x 3. Normal mood.   Labs on Admission: I have personally reviewed following labs and imaging studies  CBC: Recent Labs  Lab 09/01/20 0230  WBC 7.9  NEUTROABS 6.7  HGB 13.2  HCT 39.7  MCV 85.9  PLT 177   Basic Metabolic Panel: Recent Labs  Lab 09/01/20 0230  NA 126*  K 3.8  CL 95*  CO2 21*  GLUCOSE 110*  BUN 16  CREATININE 1.01  CALCIUM 7.9*   GFR: Estimated Creatinine Clearance: 107.2 mL/min (by C-G formula based on  SCr of 1.01 mg/dL).  Liver Function Tests: Recent Labs  Lab 09/01/20 0230  AST 39  ALT 20  ALKPHOS 38  BILITOT 0.6  PROT 6.5  ALBUMIN 3.1*   Radiological Exams on Admission: DG Chest Port 1 View  Result Date: 09/01/2020 CLINICAL DATA:  Initial evaluation for acute approximately, probable COVID. EXAM: PORTABLE CHEST 1 VIEW COMPARISON:  Prior radiograph from 12/03/2018. FINDINGS: Mild cardiomegaly.  Mediastinal silhouette within normal limits. Lungs mildly hypoinflated. Multifocal parenchymal predominantly peripheral opacity seen involving the mid and lower lungs, concerning for multifocal pneumonia, likely COVID pneumonia. No pulmonary edema or pleural effusion. No pneumothorax. No acute osseous finding. IMPRESSION: Multifocal parenchymal opacities involving the mid and lower lungs, concerning for multifocal pneumonia. COVID pneumonia is suspected. Electronically Signed   By: Rise Mu  M.D.   On: 09/01/2020 01:59    EKG: Independently reviewed.  Vent. rate 97 BPM PR interval * ms QRS duration 89 ms QT/QTc 338/430 ms P-R-T axes 49 63 50 Sinus rhythm Low voltage, precordial leads When compared with ECG of 12/03/2018  Assessment/Plan Principal Problem:   Pneumonia due to COVID-19 virus Continue supplemental oxygen. Incentive spirometry and flutter valve. Bronchodilators as needed. Analgesics as needed. Antiemetics as needed. Antitussives as needed. Continue ceftriaxone and azithromycin. Dexamethasone 6 mg IVP daily. Remdesivir declined by the patient. High CRP, suggested biological agent. The patient declined treatment with interferon. Monitor CBC, CMP and inflammatory markers.  Active Problems:   Hyponatremia Secondary to GI losses. Hypotonic fluids oral rehydration. Follow sodium level.    Protein calorie malnutrition (HCC) Heart healthy diet. Protein supplementation.    Hypertension Monitor blood pressure.    Anxiety with depression Resume  antidepressant once med rec performed.   DVT prophylaxis:           SCDs.  History of SAH. Code Status:   Full code. Family Communication: Disposition Plan:   Patient is from:  Home.  Anticipated DC to:  Home.  Anticipated DC date:  09/04/2019.  Anticipated DC barriers: Clinical status.  Consults called: Admission status:  Observation/telemetry.   Severity of Illness: Very high on a patient who has Covid pneumonia with an elevated CRP level who is declining treatment with antiviral and biological agents.  Bobette Mo MD [facility]  How to contact the [facility] Attending or Consulting provider 7A - 7P or covering provider during after hours 7P -7A, for this patient?   1. Check the care team in [facility] and look for a) attending/consulting [facility] provider listed and b) the [facility] team listed 2. Log into [email or web address] and use [facility]'s universal password to access. If you do not have the password, please contact the hospital operator. 3. Locate the [facility] provider you are looking for under [facility] and page to a number that you can be directly reached. 4. If you still have difficulty reaching the provider, please page the [facility] (Director on Call) for the Hospitalists listed on amion for assistance.  09/01/2020, 5:28 AM   This document was prepared using Dragon voice recognition software and may contain some unintended transcription errors.

## 2020-09-01 NOTE — Progress Notes (Signed)
Patient seen and examined.  Admitted after midnight secondary to shortness of breath and general malaise.  Found with positive COVID-19 infection.  Patient oxygen saturation on room air 87%; 2-3 L keeping O2 sat at rest 94 to 96%.  Patient CRP 13.4 and ferritin level 507.  CXR demonstrating multifocal parenchymal opacities in the mid and lower lungs.  Patient's symptoms have been present for the last 9 days and progressively worsening.  Patient has declined the use of remdesivir.  Started on IV steroids.  Please Refer to H&P written by Dr. Robb Matar for further info/details on admission.  Plan: -Patient has declined the use of remdesivir -Okay to the use of Solu-Medrol -Given elevated procalcitonin level will provide antibiotic coverage for the possibility of superimposed bacterial infection. -Continue as needed bronchodilators, antitussive medication and supportive care -Patient instructed on proning position, and to use incentive spirometer and flutter valve. -Continue Vitamin C, zinc and follow inflammatory markers.   Vassie Loll MD [phone]

## 2020-09-02 DIAGNOSIS — E860 Dehydration: Secondary | ICD-10-CM | POA: Diagnosis present

## 2020-09-02 DIAGNOSIS — Z79899 Other long term (current) drug therapy: Secondary | ICD-10-CM | POA: Diagnosis not present

## 2020-09-02 DIAGNOSIS — Z9682 Presence of neurostimulator: Secondary | ICD-10-CM | POA: Diagnosis not present

## 2020-09-02 DIAGNOSIS — R0902 Hypoxemia: Secondary | ICD-10-CM | POA: Diagnosis present

## 2020-09-02 DIAGNOSIS — E871 Hypo-osmolality and hyponatremia: Secondary | ICD-10-CM | POA: Diagnosis present

## 2020-09-02 DIAGNOSIS — B370 Candidal stomatitis: Secondary | ICD-10-CM | POA: Diagnosis present

## 2020-09-02 DIAGNOSIS — F418 Other specified anxiety disorders: Secondary | ICD-10-CM | POA: Diagnosis present

## 2020-09-02 DIAGNOSIS — I10 Essential (primary) hypertension: Secondary | ICD-10-CM | POA: Diagnosis present

## 2020-09-02 DIAGNOSIS — U071 COVID-19: Secondary | ICD-10-CM | POA: Diagnosis present

## 2020-09-02 DIAGNOSIS — E669 Obesity, unspecified: Secondary | ICD-10-CM | POA: Diagnosis present

## 2020-09-02 DIAGNOSIS — Z87891 Personal history of nicotine dependence: Secondary | ICD-10-CM | POA: Diagnosis not present

## 2020-09-02 DIAGNOSIS — J9601 Acute respiratory failure with hypoxia: Secondary | ICD-10-CM | POA: Diagnosis present

## 2020-09-02 DIAGNOSIS — K219 Gastro-esophageal reflux disease without esophagitis: Secondary | ICD-10-CM | POA: Diagnosis present

## 2020-09-02 DIAGNOSIS — J1282 Pneumonia due to coronavirus disease 2019: Secondary | ICD-10-CM | POA: Diagnosis present

## 2020-09-02 DIAGNOSIS — Z6836 Body mass index (BMI) 36.0-36.9, adult: Secondary | ICD-10-CM | POA: Diagnosis not present

## 2020-09-02 DIAGNOSIS — Z9049 Acquired absence of other specified parts of digestive tract: Secondary | ICD-10-CM | POA: Diagnosis not present

## 2020-09-02 DIAGNOSIS — G4700 Insomnia, unspecified: Secondary | ICD-10-CM | POA: Diagnosis present

## 2020-09-02 LAB — HIV ANTIBODY (ROUTINE TESTING W REFLEX): HIV Screen 4th Generation wRfx: NONREACTIVE

## 2020-09-02 MED ORDER — ENOXAPARIN SODIUM 40 MG/0.4ML ~~LOC~~ SOLN
40.0000 mg | SUBCUTANEOUS | Status: DC
  Administered 2020-09-02: 40 mg via SUBCUTANEOUS
  Filled 2020-09-02: qty 0.4

## 2020-09-02 MED ORDER — PANTOPRAZOLE SODIUM 40 MG PO TBEC
40.0000 mg | DELAYED_RELEASE_TABLET | Freq: Every day | ORAL | Status: DC
  Administered 2020-09-02 – 2020-09-05 (×4): 40 mg via ORAL
  Filled 2020-09-02 (×4): qty 1

## 2020-09-02 MED ORDER — NYSTATIN 100000 UNIT/ML MT SUSP
5.0000 mL | Freq: Four times a day (QID) | OROMUCOSAL | Status: DC
  Administered 2020-09-02 – 2020-09-05 (×11): 500000 [IU] via ORAL
  Filled 2020-09-02 (×11): qty 5

## 2020-09-02 NOTE — Unmapped (Signed)
Formatting of this note might be different from the original.    Problem: Education:  Goal: Knowledge of General Education information will improve  Description: Including pain rating scale, medication(s)/side effects and non-pharmacologic comfort measures  Outcome: Progressing    Problem: Health Behavior/Discharge Planning:  Goal: Ability to manage health-related needs will improve  Outcome: Progressing    Problem: Clinical Measurements:  Goal: Ability to maintain clinical measurements within normal limits will improve  Outcome: Progressing  Goal: Will remain free from infection  Outcome: Progressing  Goal: Diagnostic test results will improve  Outcome: Progressing  Goal: Respiratory complications will improve  Outcome: Progressing  Goal: Cardiovascular complication will be avoided  Outcome: Progressing    Problem: Activity:  Goal: Risk for activity intolerance will decrease  Outcome: Progressing    Problem: Nutrition:  Goal: Adequate nutrition will be maintained  Outcome: Progressing    Problem: Coping:  Goal: Level of anxiety will decrease  Outcome: Progressing    Problem: Elimination:  Goal: Will not experience complications related to bowel motility  Outcome: Progressing  Goal: Will not experience complications related to urinary retention  Outcome: Progressing    Problem: Pain Managment:  Goal: General experience of comfort will improve  Outcome: Progressing    Problem: Safety:  Goal: Ability to remain free from injury will improve  Outcome: Progressing    Problem: Skin Integrity:  Goal: Risk for impaired skin integrity will decrease  Outcome: Progressing    Problem: Education:  Goal: Knowledge of risk factors and measures for prevention of condition will improve  Outcome: Progressing    Problem: Coping:  Goal: Psychosocial and spiritual needs will be supported  Outcome: Progressing    Problem: Respiratory:  Goal: Will maintain a patent airway  Outcome: Progressing  Goal: Complications related to the disease  process, condition or treatment will be avoided or minimized  Outcome: Progressing    Electronically signed by Sheela Stack, RN at 09/02/2020 10:19 AM EST

## 2020-09-02 NOTE — Progress Notes (Signed)
Formatting of this note is different from the original.  Images from the original note were not included.    PROGRESS NOTE    Jeremy Park  EAV:[id number] DOB: 1966-02-22 DOA: 09/01/2020  PCP: Richardean Chimera, MD     Chief complaint: Shortness of breath, intermittent coughing spells and hypoxia.    Brief Narrative:   As per H&P written by Dr. Robb Matar on 09/01/2020  Jeremy Park is a 55 y.o. male with medical history significant of anxiety, depression, hypertension, class II obesity, history of back surgery who is coming to the emergency department with a history of 9 days of progressively worse viral symptoms which include sore throat, cough of occasionally productive greenish sputum, fatigue, malaise, pleuritic chest pain, abdominal pain, multiple episodes of diarrhea, emesis, decreased appetite and some difficulty sleeping.  He saw his PCP several days ago and was prescribed Paxlovid, but after seeing the medication information sheet he took it back to the pharmacy and did not take it because of potential side effects.  He denies lower extremity edema, dysuria, frequency or hematuria.  No polyuria, polydipsia, polyphagia or blurred vision.    ED Course: Initial vital signs were temperature 100.5 F, pulse 104, respirations 36, BP 145/83 mmHg O2 sat was 87 to 92% on room air.  The patient was placed on nasal cannula O2 at 2 LPM and his oxygen saturation increased to 94 to 96%.  The patient received dexamethasone, ceftriaxone and azithromycin.  He declined remdesivir and interferon treatment.    Assessment & Plan:  1-acute respiratory failure with hypoxia secondary to COVID-19 pneumonia:  -Continue treatment with steroids, IV antibiotics and bronchodilators  -Wean oxygen supplementation to room air as tolerated  -Continue supportive care, antitussive/mucolytic management.  -Patient reports feeling slightly better today  -Still requiring 2.5 L nasal cannula supplementation.  -We will check oxygen desaturation  screening.  -Encourage to follow proning position and to use incentive respirometer/flutter valve.  -Continue to follow telemetry markers    2-gastroesophageal reflux disease/GI prophylaxis  -Continue PPI.    3-class II obesity  -Low calorie diet and portion control has been discussed with patient.  -Body mass index is 36.58 kg/m.    4-Anxiety with depression  -Mood overall stable  -Continue BuSpar and Cymbalta  -No hallucinations, no suicidal ideation.    5-Hyponatremia/mild dehydration  -In the setting of decreased oral intake  -Gentle fluid resuscitation provided at time of admission  -Patient tolerating better oral ingestion currently.    6-thrush  -Will treat with nystatin.    DVT prophylaxis: Lovenox  Code Status: Full code  Family Communication: No family at bedside.  Disposition:     Status is: Inpatient    Dispo: The patient is from: Home               Anticipated d/c is to: Home               Anticipated d/c date is: 1-2 days               Patient currently no medically ready for discharge; still requiring oxygen supplementation and feeling short winded with minimal activity.  We will continue treatment with steroids, IV antibiotics, titrate down oxygen to room air as tolerated and provide as needed bronchodilator.      Consultants:    None     Procedures:   See below for x-ray reports     Antimicrobials:   Rocephin/Zithromax    Subjective:  Afebrile, no  chest pain, no nausea, no vomiting.  Reports feeling slightly better today, but still short winded with activity and having throat discomfort.  2.5 L nasal cannula supplementation in place.    Objective:  Vitals:    09/01/20 2218 09/01/20 2350 09/02/20 0340 09/02/20 1341   BP: (!) 149/89 (!) 145/81 (!) 144/93 140/87   Pulse: 85 80 89 100   Resp: 19 18 18 19    Temp: 98.2 F (36.8 C) 98.4 F (36.9 C) 98.5 F (36.9 C)    TempSrc:       SpO2: 92% 94% 95% 99%   Weight:       Height:         Intake/Output Summary (Last 24 hours) at 09/02/2020 1451  Last  data filed at 09/02/2020 1300  Gross per 24 hour   Intake 480 ml   Output 900 ml   Net -420 ml     Filed Weights    09/01/20 0051   Weight: 119 kg     Examination:    General exam: Afebrile, no nausea, no vomiting, no fever currently.  Still reporting shortness of breath and short winded sensation with activity.  Using 2.5 L nasal cannula supplementation.  Patient reported feeling better overall.  Respiratory system: No wheezing, positive rhonchi bilaterally; no using accessory muscle.  Cardiovascular system: S1 & S2 heard, RRR. No JVD, murmurs, rubs, gallops or clicks. No pedal edema.  Gastrointestinal system: Abdomen is obese, nondistended, soft and nontender. No organomegaly or masses felt. Normal bowel sounds heard.  Central nervous system: Alert and oriented. No focal neurological deficits.  Extremities: No cyanosis or clubbing  Skin: No petechiae.  Psychiatry: Judgement and insight appear normal. Mood & affect appropriate.     Data Reviewed: I have personally reviewed following labs and imaging studies    CBC:  Recent Labs   Lab 09/01/20  0230   WBC 7.9   NEUTROABS 6.7   HGB 13.2   HCT 39.7   MCV 85.9   PLT 177     Basic Metabolic Panel:  Recent Labs   Lab 09/01/20  0230   NA 126*   K 3.8   CL 95*   CO2 21*   GLUCOSE 110*   BUN 16   CREATININE 1.01   CALCIUM 7.9*     GFR:  Estimated Creatinine Clearance: 107.2 mL/min (by C-G formula based on SCr of 1.01 mg/dL).    Liver Function Tests:  Recent Labs   Lab 09/01/20  0230   AST 39   ALT 20   ALKPHOS 38   BILITOT 0.6   PROT 6.5   ALBUMIN 3.1*     CBG:  No results for input(s): GLUCAP in the last 168 hours.    Recent Results (from the past 240 hour(s))   Resp Panel by RT-PCR (Flu A&B, Covid) Nasopharyngeal Swab     Status: Abnormal    Collection Time: 09/01/20  2:25 AM    Specimen: Nasopharyngeal Swab; Nasopharyngeal(NP) swabs in vial transport medium   Result Value Ref Range Status    SARS Coronavirus 2 by RT PCR POSITIVE (A) NEGATIVE Final     Comment: RESULT  CALLED TO, READ BACK BY AND VERIFIED WITH:  NICKOLS,K @ 0340 ON 09/01/20 BY JUW  (NOTE)  SARS-CoV-2 target nucleic acids are DETECTED.    The SARS-CoV-2 RNA is generally detectable in upper respiratory  specimens during the acute phase of infection. Positive results are  indicative of the presence of the  identified virus, but do not rule  out bacterial infection or co-infection with other pathogens not  detected by the test. Clinical correlation with patient history and  other diagnostic information is necessary to determine patient  infection status. The expected result is Negative.    Fact Sheet for Patients:  BloggerCourse.com    Fact Sheet for Healthcare Providers:  SeriousBroker.it    This test is not yet approved or cleared by the [place] FDA and   has been authorized for detection and/or diagnosis of SARS-CoV-2 by  FDA under an Emergency Use Authorization (EUA).  This EUA will  remain in effect (meaning this test can be  used) for the duration of   the COVID-19 declaration under Section 564(b)(1) of the Act, 21  U.S.C. section 360bbb-3(b)(1), unless the authorization is  terminated or revoked sooner.       Influenza A by PCR NEGATIVE NEGATIVE Final    Influenza B by PCR NEGATIVE NEGATIVE Final     Comment: (NOTE)  The Xpert Xpress SARS-CoV-2/FLU/RSV plus assay is intended as an aid  in the diagnosis of influenza from Nasopharyngeal swab specimens and  should not be used as a sole basis for treatment. Nasal washings and  aspirates are unacceptable for Xpert Xpress SARS-CoV-2/FLU/RSV  testing.    Fact Sheet for Patients:  BloggerCourse.com    Fact Sheet for Healthcare Providers:  SeriousBroker.it    This test is not yet approved or cleared by the [place] FDA and  has been authorized for detection and/or diagnosis of SARS-CoV-2 by  FDA under an Emergency Use Authorization (EUA). This EUA will remain  in  effect (meaning this test can be used) for the duration of the  COVID-19 declaration under Section 564(b)(1) of the Act, 21 U.S.C.  section 360bbb-3(b)(1), unless the authorization is terminated or  revoked.    Performed at [facility], [address], [place], [place] [address]    Blood Culture (routine x 2)     Status: None (Preliminary result)    Collection Time: 09/01/20  2:29 AM    Specimen: Right Antecubital; Blood   Result Value Ref Range Status    Specimen Description RIGHT ANTECUBITAL  Final    Special Requests   Final     BOTTLES DRAWN AEROBIC AND ANAEROBIC Blood Culture adequate volume    Culture   Final     NO GROWTH 1 DAY  Performed at [facility], [address], [place], [place] [address]     Report Status PENDING  Incomplete   Blood Culture (routine x 2)     Status: None (Preliminary result)    Collection Time: 09/01/20  2:31 AM    Specimen: BLOOD RIGHT HAND   Result Value Ref Range Status    Specimen Description BLOOD RIGHT HAND  Final    Special Requests   Final     BOTTLES DRAWN AEROBIC AND ANAEROBIC Blood Culture adequate volume    Culture   Final     NO GROWTH 1 DAY  Performed at [facility], [address] [place], [place] [address]     Report Status PENDING  Incomplete       Radiology Studies:  DG Chest Port 1 View    Result Date: 09/01/2020  CLINICAL DATA:  Initial evaluation for acute approximately, probable COVID. EXAM: PORTABLE CHEST 1 VIEW COMPARISON:  Prior radiograph from 12/03/2018. FINDINGS: Mild cardiomegaly.  Mediastinal silhouette within normal limits. Lungs mildly hypoinflated. Multifocal parenchymal predominantly peripheral opacity seen  involving the mid and lower lungs, concerning for multifocal pneumonia, likely COVID pneumonia. No pulmonary edema or pleural effusion. No pneumothorax. No acute osseous finding. IMPRESSION: Multifocal parenchymal opacities involving the mid and lower lungs, concerning for multifocal pneumonia. COVID pneumonia is suspected. Electronically Signed    By: Rise Mu M.D.   On: 09/01/2020 01:59     Scheduled Meds:  ? vitamin C  500 mg Oral Daily   ? busPIRone  20 mg Oral TID   ? DULoxetine  60 mg Oral Daily   ? methylPREDNISolone (SOLU-MEDROL) injection  60 mg Intravenous Q12H   ? nystatin  5 mL Oral QID   ? zinc sulfate  220 mg Oral Daily     Continuous Infusions:  ? azithromycin 500 mg (09/02/20 0205)   ? cefTRIAXone (ROCEPHIN)  IV 2 g (09/02/20 0350)      LOS: 0 days     Time spent: 30 minutes    Vassie Loll, MD  [facility]    To contact the attending provider between 7A-7P or the covering provider during after hours 7P-7A, please log into the web site [email or web address] and access using universal [facility] password for that web site. If you do not have the password, please call the hospital operator.    09/02/2020, 2:51 PM       Electronically signed by Vassie Loll, MD at 09/02/2020  3:00 PM EST

## 2020-09-02 NOTE — Progress Notes (Signed)
Formatting of this note might be different from the original.  SATURATION QUALIFICATIONS: (This note is used to comply with regulatory documentation for home oxygen)    Patient Saturations on Room Air at Rest = 96%    Patient Saturations on Room Air while Ambulating = 77%    Patient Saturations on 3 Liters of oxygen while Ambulating = 91%    Please briefly explain why patient needs home oxygen: to maintain O2 sats greater than 90% while ambulating.  Manya Silvas, RN   Electronically signed by Sheela Stack, RN at 09/02/2020  3:59 PM EST

## 2020-09-02 NOTE — Plan of Care (Signed)

## 2020-09-02 NOTE — Progress Notes (Signed)
SATURATION QUALIFICATIONS: (This note is used to comply with regulatory documentation for home oxygen)  Patient Saturations on Room Air at Rest = 96%  Patient Saturations on Room Air while Ambulating = 77%  Patient Saturations on 3 Liters of oxygen while Ambulating = 91%  Please briefly explain why patient needs home oxygen: to maintain O2 sats greater than 90% while ambulating. Manya Silvas, RN

## 2020-09-02 NOTE — Progress Notes (Signed)
PROGRESS NOTE    Aaron Soto  JIR:[id number] DOB: Aug 24, 1966 DOA: 09/01/2020 PCP: Richardean Chimera, MD   Chief complaint: Shortness of breath, intermittent coughing spells and hypoxia.  Brief Narrative:  As per H&P written by Dr. Robb Matar on 09/01/2020 Aaron Soto is a 55 y.o. male with medical history significant of anxiety, depression, hypertension, class II obesity, history of back surgery who is coming to the emergency department with a history of 9 days of progressively worse viral symptoms which include sore throat, cough of occasionally productive greenish sputum, fatigue, malaise, pleuritic chest pain, abdominal pain, multiple episodes of diarrhea, emesis, decreased appetite and some difficulty sleeping.  He saw his PCP several days ago and was prescribed Paxlovid, but after seeing the medication information sheet he took it back to the pharmacy and did not take it because of potential side effects.  He denies lower extremity edema, dysuria, frequency or hematuria.  No polyuria, polydipsia, polyphagia or blurred vision.  ED Course: Initial vital signs were temperature 100.5 F, pulse 104, respirations 36, BP 145/83 mmHg O2 sat was 87 to 92% on room air.  The patient was placed on nasal cannula O2 at 2 LPM and his oxygen saturation increased to 94 to 96%.  The patient received dexamethasone, ceftriaxone and azithromycin.  He declined remdesivir and interferon treatment.  Assessment & Plan: 1-acute respiratory failure with hypoxia secondary to COVID-19 pneumonia: -Continue treatment with steroids, IV antibiotics and bronchodilators -Wean oxygen supplementation to room air as tolerated -Continue supportive care, antitussive/mucolytic management. -Patient reports feeling slightly better today -Still requiring 2.5 L nasal cannula supplementation. -We will check oxygen desaturation screening. -Encourage to follow proning position and to use incentive respirometer/flutter valve. -Continue to  follow telemetry markers  2-gastroesophageal reflux disease/GI prophylaxis -Continue PPI.  3-class II obesity -Low calorie diet and portion control has been discussed with patient. -Body mass index is 36.58 kg/m.  4-Anxiety with depression -Mood overall stable -Continue BuSpar and Cymbalta -No hallucinations, no suicidal ideation.  5-Hyponatremia/mild dehydration -In the setting of decreased oral intake -Gentle fluid resuscitation provided at time of admission -Patient tolerating better oral ingestion currently.  6-thrush -Will treat with nystatin.  DVT prophylaxis: Lovenox Code Status: Full code Family Communication: No family at bedside. Disposition:   Status is: Inpatient  Dispo: The patient is from: Home              Anticipated d/c is to: Home              Anticipated d/c date is: 1-2 days              Patient currently no medically ready for discharge; still requiring oxygen supplementation and feeling short winded with minimal activity.  We will continue treatment with steroids, IV antibiotics, titrate down oxygen to room air as tolerated and provide as needed bronchodilator.       Consultants:   None   Procedures:  See below for x-ray reports   Antimicrobials:  Rocephin/Zithromax   Subjective: Afebrile, no chest pain, no nausea, no vomiting.  Reports feeling slightly better today, but still short winded with activity and having throat discomfort.  2.5 L nasal cannula supplementation in place.  Objective: Vitals:   09/01/20 2218 09/01/20 2350 09/02/20 0340 09/02/20 1341  BP: (!) 149/89 (!) 145/81 (!) 144/93 140/87  Pulse: 85 80 89 100  Resp: 19 18 18 19   Temp: 98.2 F (36.8 C) 98.4 F (36.9 C) 98.5 F (36.9 C)   TempSrc:  SpO2: 92% 94% 95% 99%  Weight:      Height:        Intake/Output Summary (Last 24 hours) at 09/02/2020 1451 Last data filed at 09/02/2020 1300 Gross per 24 hour  Intake 480 ml  Output 900 ml  Net -420 ml   Filed  Weights   09/01/20 0051  Weight: 119 kg    Examination:  General exam: Afebrile, no nausea, no vomiting, no fever currently.  Still reporting shortness of breath and short winded sensation with activity.  Using 2.5 L nasal cannula supplementation.  Patient reported feeling better overall. Respiratory system: No wheezing, positive rhonchi bilaterally; no using accessory muscle. Cardiovascular system: S1 & S2 heard, RRR. No JVD, murmurs, rubs, gallops or clicks. No pedal edema. Gastrointestinal system: Abdomen is obese, nondistended, soft and nontender. No organomegaly or masses felt. Normal bowel sounds heard. Central nervous system: Alert and oriented. No focal neurological deficits. Extremities: No cyanosis or clubbing Skin: No petechiae. Psychiatry: Judgement and insight appear normal. Mood & affect appropriate.     Data Reviewed: I have personally reviewed following labs and imaging studies  CBC: Recent Labs  Lab 09/01/20 0230  WBC 7.9  NEUTROABS 6.7  HGB 13.2  HCT 39.7  MCV 85.9  PLT 177    Basic Metabolic Panel: Recent Labs  Lab 09/01/20 0230  NA 126*  K 3.8  CL 95*  CO2 21*  GLUCOSE 110*  BUN 16  CREATININE 1.01  CALCIUM 7.9*    GFR: Estimated Creatinine Clearance: 107.2 mL/min (by C-G formula based on SCr of 1.01 mg/dL).  Liver Function Tests: Recent Labs  Lab 09/01/20 0230  AST 39  ALT 20  ALKPHOS 38  BILITOT 0.6  PROT 6.5  ALBUMIN 3.1*    CBG: No results for input(s): GLUCAP in the last 168 hours.   Recent Results (from the past 240 hour(s))  Resp Panel by RT-PCR (Flu A&B, Covid) Nasopharyngeal Swab     Status: Abnormal   Collection Time: 09/01/20  2:25 AM   Specimen: Nasopharyngeal Swab; Nasopharyngeal(NP) swabs in vial transport medium  Result Value Ref Range Status   SARS Coronavirus 2 by RT PCR POSITIVE (A) NEGATIVE Final    Comment: RESULT CALLED TO, READ BACK BY AND VERIFIED WITH: NICKOLS,K @ 0340 ON 09/01/20 BY  JUW (NOTE) SARS-CoV-2 target nucleic acids are DETECTED.  The SARS-CoV-2 RNA is generally detectable in upper respiratory specimens during the acute phase of infection. Positive results are indicative of the presence of the identified virus, but do not rule out bacterial infection or co-infection with other pathogens not detected by the test. Clinical correlation with patient history and other diagnostic information is necessary to determine patient infection status. The expected result is Negative.  Fact Sheet for Patients: BloggerCourse.com  Fact Sheet for Healthcare Providers: SeriousBroker.it  This test is not yet approved or cleared by the [place] FDA and  has been authorized for detection and/or diagnosis of SARS-CoV-2 by FDA under an Emergency Use Authorization (EUA).  This EUA will remain in effect (meaning this test can be  used) for the duration of  the COVID-19 declaration under Section 564(b)(1) of the Act, 21 U.S.C. section 360bbb-3(b)(1), unless the authorization is terminated or revoked sooner.     Influenza A by PCR NEGATIVE NEGATIVE Final   Influenza B by PCR NEGATIVE NEGATIVE Final    Comment: (NOTE) The Xpert Xpress SARS-CoV-2/FLU/RSV plus assay is intended as an aid in the diagnosis of influenza from Nasopharyngeal swab  specimens and should not be used as a sole basis for treatment. Nasal washings and aspirates are unacceptable for Xpert Xpress SARS-CoV-2/FLU/RSV testing.  Fact Sheet for Patients: BloggerCourse.com  Fact Sheet for Healthcare Providers: SeriousBroker.it  This test is not yet approved or cleared by the [place] FDA and has been authorized for detection and/or diagnosis of SARS-CoV-2 by FDA under an Emergency Use Authorization (EUA). This EUA will remain in effect (meaning this test can be used) for the duration of the COVID-19  declaration under Section 564(b)(1) of the Act, 21 U.S.C. section 360bbb-3(b)(1), unless the authorization is terminated or revoked.  Performed at [facility], [address], [place], [place] [address]   Blood Culture (routine x 2)     Status: None (Preliminary result)   Collection Time: 09/01/20  2:29 AM   Specimen: Right Antecubital; Blood  Result Value Ref Range Status   Specimen Description RIGHT ANTECUBITAL  Final   Special Requests   Final    BOTTLES DRAWN AEROBIC AND ANAEROBIC Blood Culture adequate volume   Culture   Final    NO GROWTH 1 DAY Performed at [facility], [address] [place], [place] [address]    Report Status PENDING  Incomplete  Blood Culture (routine x 2)     Status: None (Preliminary result)   Collection Time: 09/01/20  2:31 AM   Specimen: BLOOD RIGHT HAND  Result Value Ref Range Status   Specimen Description BLOOD RIGHT HAND  Final   Special Requests   Final    BOTTLES DRAWN AEROBIC AND ANAEROBIC Blood Culture adequate volume   Culture   Final    NO GROWTH 1 DAY Performed at [facility], [address] [place], [place] [address]    Report Status PENDING  Incomplete     Radiology Studies: DG Chest Port 1 View  Result Date: 09/01/2020 CLINICAL DATA:  Initial evaluation for acute approximately, probable COVID. EXAM: PORTABLE CHEST 1 VIEW COMPARISON:  Prior radiograph from 12/03/2018. FINDINGS: Mild cardiomegaly.  Mediastinal silhouette within normal limits. Lungs mildly hypoinflated. Multifocal parenchymal predominantly peripheral opacity seen involving the mid and lower lungs, concerning for multifocal pneumonia, likely COVID pneumonia. No pulmonary edema or pleural effusion. No pneumothorax. No acute osseous finding. IMPRESSION: Multifocal parenchymal opacities involving the mid and lower lungs, concerning for multifocal pneumonia. COVID pneumonia is suspected. Electronically Signed   By: Rise Mu M.D.   On: 09/01/2020 01:59   Scheduled  Meds: . vitamin C  500 mg Oral Daily  . busPIRone  20 mg Oral TID  . DULoxetine  60 mg Oral Daily  . methylPREDNISolone (SOLU-MEDROL) injection  60 mg Intravenous Q12H  . nystatin  5 mL Oral QID  . zinc sulfate  220 mg Oral Daily   Continuous Infusions: . azithromycin 500 mg (09/02/20 0205)  . cefTRIAXone (ROCEPHIN)  IV 2 g (09/02/20 0350)     LOS: 0 days    Time spent: 30 minutes    Vassie Loll, MD [facility]   To contact the attending provider between 7A-7P or the covering provider during after hours 7P-7A, please log into the web site [email or web address] and access using universal [facility] password for that web site. If you do not have the password, please call the hospital operator.  09/02/2020, 2:51 PM

## 2020-09-03 DIAGNOSIS — E871 Hypo-osmolality and hyponatremia: Secondary | ICD-10-CM | POA: Diagnosis not present

## 2020-09-03 DIAGNOSIS — U071 COVID-19: Secondary | ICD-10-CM | POA: Diagnosis not present

## 2020-09-03 DIAGNOSIS — J9601 Acute respiratory failure with hypoxia: Secondary | ICD-10-CM | POA: Diagnosis not present

## 2020-09-03 DIAGNOSIS — F418 Other specified anxiety disorders: Secondary | ICD-10-CM | POA: Diagnosis not present

## 2020-09-03 MED ORDER — ENOXAPARIN SODIUM 60 MG/0.6ML ~~LOC~~ SOLN
60.0000 mg | SUBCUTANEOUS | Status: DC
  Administered 2020-09-03 – 2020-09-04 (×2): 60 mg via SUBCUTANEOUS
  Filled 2020-09-03 (×2): qty 0.6

## 2020-09-03 NOTE — Progress Notes (Signed)
Formatting of this note might be different from the original.  SATURATION QUALIFICATIONS: (This note is used to comply with regulatory documentation for home oxygen)    Patient Saturations on Room Air at Rest = 84%    Patient Saturations on Room Air on exertion = 82%    Patient Saturations on 4 Liters of oxygen while Ambulating = 93%    Please briefly explain why patient needs home oxygen:  Electronically signed by Liborio Nixon, RN at 09/03/2020 11:37 AM EST

## 2020-09-03 NOTE — Progress Notes (Signed)
Formatting of this note is different from the original.  Images from the original note were not included.    PROGRESS NOTE    Jeremy Park  BJY:[id number] DOB: [birth year] DOA: 09/01/2020  PCP: Richardean Chimera, MD     Chief complaint: Shortness of breath, intermittent coughing spells and hypoxia.    Brief Narrative:   As per H&P written by Dr. Robb Matar on 09/01/2020  Jeremy Park is a 55 y.o. male with medical history significant of anxiety, depression, hypertension, class II obesity, history of back surgery who is coming to the emergency department with a history of 9 days of progressively worse viral symptoms which include sore throat, cough of occasionally productive greenish sputum, fatigue, malaise, pleuritic chest pain, abdominal pain, multiple episodes of diarrhea, emesis, decreased appetite and some difficulty sleeping.  He saw his PCP several days ago and was prescribed Paxlovid, but after seeing the medication information sheet he took it back to the pharmacy and did not take it because of potential side effects.  He denies lower extremity edema, dysuria, frequency or hematuria.  No polyuria, polydipsia, polyphagia or blurred vision.    ED Course: Initial vital signs were temperature 100.5 F, pulse 104, respirations 36, BP 145/83 mmHg O2 sat was 87 to 92% on room air.  The patient was placed on nasal cannula O2 at 2 LPM and his oxygen saturation increased to 94 to 96%.  The patient received dexamethasone, ceftriaxone and azithromycin.  He declined remdesivir and interferon treatment.    Assessment & Plan:  1-acute respiratory failure with hypoxia secondary to COVID-19 pneumonia:  -Continue treatment with steroids, IV antibiotics and bronchodilators  -Continue to wean oxygen supplementation to room air as tolerated; but appears that he will require oxygen to go home.  -Continue supportive care, antitussive/mucolytic management.  -Patient reports feeling short of breath and more winded with activity.  -Requiring 4  L nasal cannula supplementation currently.  -Will recheck oxygen desaturation screening in a.m.  -Encourage to continue proning position and to use incentive respirometer/flutter valve.  -Continue to follow inflammatory markers.    2-gastroesophageal reflux disease/GI prophylaxis  -Continue treatment with PPI.    3-class II obesity  -Low calorie diet and portion control has been discussed with patient.  -Body mass index is 36.58 kg/m.    4-Anxiety with depression  -Mood overall stable  -Continue BuSpar and Cymbalta  -No hallucinations, no suicidal ideation.  -Intermittently anxious when feeling short of breath with activity.    5-Hyponatremia/mild dehydration  -In the setting of decreased oral intake  -Gentle fluid resuscitation provided at time of admission  -Patient tolerating better oral ingestion currently.  -Advised to maintain adequate hydration.    6-thrush  -Will continue treatment with nystatin.    DVT prophylaxis: Lovenox  Code Status: Full code  Family Communication: No family at bedside.  Disposition:     Status is: Inpatient    Dispo: The patient is from: Home               Anticipated d/c is to: Home               Anticipated d/c date is: 1-2 days               Patient currently no medically ready for discharge; still requiring oxygen supplementation and feeling short winded with minimal activity.  We will continue treatment with steroids, IV antibiotics, titrate down oxygen to room air as tolerated and provide as needed bronchodilator.  Consultants:    None     Procedures:   See below for x-ray reports     Antimicrobials:   Rocephin/Zithromax    Subjective:  No fever, no chest pain, no nausea, no vomiting.  Reports feeling more short of breath with activity and experiencing intermittent coughing spells.  Some improvement in his throat discomfort reported.  Patient is requiring 4 L nasal cannula supplementation currently.    Objective:  Vitals:    09/03/20 0643 09/03/20 1135 09/03/20 1137  09/03/20 1340   BP: 136/87   134/81   Pulse: 96   97   Resp: 18   19   Temp: 97.8 F (36.6 C)   98 F (36.7 C)   TempSrc: Oral      SpO2: 98% (!) 87% 93% 96%   Weight:       Height:         Intake/Output Summary (Last 24 hours) at 09/03/2020 1423  Last data filed at 09/03/2020 1300  Gross per 24 hour   Intake 1355 ml   Output 1750 ml   Net -395 ml     Filed Weights    09/01/20 0051   Weight: 119 kg     Examination:  General exam: Alert, awake, oriented x 3; afebrile, no chest pain, no nausea, no vomiting.  Patient has ended requiring O2 4 L supplementation today and feeling short of breath with minimal exertion.  He is very apprehensive about going home with oxygen at this time.  Respiratory system: Positive rhonchi bilaterally; not using accessory muscles, appreciated tachypnea with exertion, no wheezing.  Cardiovascular system:RRR. No murmurs, rubs, gallops.  Gastrointestinal system: Abdomen is obese, nondistended, soft and nontender. No organomegaly or masses felt. Normal bowel sounds heard.  Central nervous system: Alert and oriented. No focal neurological deficits.  Extremities: No C/C/E, +pedal pulses  Skin: No petechiae.  Psychiatry: Judgement and insight appear normal. Mood & affect appropriate.     Data Reviewed: I have personally reviewed following labs and imaging studies    CBC:  Recent Labs   Lab 09/01/20  0230   WBC 7.9   NEUTROABS 6.7   HGB 13.2   HCT 39.7   MCV 85.9   PLT 177     Basic Metabolic Panel:  Recent Labs   Lab 09/01/20  0230   NA 126*   K 3.8   CL 95*   CO2 21*   GLUCOSE 110*   BUN 16   CREATININE 1.01   CALCIUM 7.9*     GFR:  Estimated Creatinine Clearance: 107.2 mL/min (by C-G formula based on SCr of 1.01 mg/dL).    Liver Function Tests:  Recent Labs   Lab 09/01/20  0230   AST 39   ALT 20   ALKPHOS 38   BILITOT 0.6   PROT 6.5   ALBUMIN 3.1*     CBG:  No results for input(s): GLUCAP in the last 168 hours.    Recent Results (from the past 240 hour(s))   Resp Panel by RT-PCR (Flu A&B,  Covid) Nasopharyngeal Swab     Status: Abnormal    Collection Time: 09/01/20  2:25 AM    Specimen: Nasopharyngeal Swab; Nasopharyngeal(NP) swabs in vial transport medium   Result Value Ref Range Status    SARS Coronavirus 2 by RT PCR POSITIVE (A) NEGATIVE Final     Comment: RESULT CALLED TO, READ BACK BY AND VERIFIED WITH:  NICKOLS,K @ 0340 ON 09/01/20 BY JUW  (  NOTE)  SARS-CoV-2 target nucleic acids are DETECTED.    The SARS-CoV-2 RNA is generally detectable in upper respiratory  specimens during the acute phase of infection. Positive results are  indicative of the presence of the identified virus, but do not rule  out bacterial infection or co-infection with other pathogens not  detected by the test. Clinical correlation with patient history and  other diagnostic information is necessary to determine patient  infection status. The expected result is Negative.    Fact Sheet for Patients:  BloggerCourse.com    Fact Sheet for Healthcare Providers:  SeriousBroker.it    This test is not yet approved or cleared by the [place] FDA and   has been authorized for detection and/or diagnosis of SARS-CoV-2 by  FDA under an Emergency Use Authorization (EUA).  This EUA will  remain in effect (meaning this test can be  used) for the duration of   the COVID-19 declaration under Section 564(b)(1) of the Act, 21  U.S.C. section 360bbb-3(b)(1), unless the authorization is  terminated or revoked sooner.       Influenza A by PCR NEGATIVE NEGATIVE Final    Influenza B by PCR NEGATIVE NEGATIVE Final     Comment: (NOTE)  The Xpert Xpress SARS-CoV-2/FLU/RSV plus assay is intended as an aid  in the diagnosis of influenza from Nasopharyngeal swab specimens and  should not be used as a sole basis for treatment. Nasal washings and  aspirates are unacceptable for Xpert Xpress SARS-CoV-2/FLU/RSV  testing.    Fact Sheet for Patients:  BloggerCourse.com    Fact Sheet for  Healthcare Providers:  SeriousBroker.it    This test is not yet approved or cleared by the [place] FDA and  has been authorized for detection and/or diagnosis of SARS-CoV-2 by  FDA under an Emergency Use Authorization (EUA). This EUA will remain  in effect (meaning this test can be used) for the duration of the  COVID-19 declaration under Section 564(b)(1) of the Act, 21 U.S.C.  section 360bbb-3(b)(1), unless the authorization is terminated or  revoked.    Performed at [facility], [address], [place], [place] [address]    Blood Culture (routine x 2)     Status: None (Preliminary result)    Collection Time: 09/01/20  2:29 AM    Specimen: Right Antecubital; Blood   Result Value Ref Range Status    Specimen Description RIGHT ANTECUBITAL  Final    Special Requests   Final     BOTTLES DRAWN AEROBIC AND ANAEROBIC Blood Culture adequate volume    Culture   Final     NO GROWTH 2 DAYS  Performed at [facility], [address] [place], [place] [address]     Report Status PENDING  Incomplete   Blood Culture (routine x 2)     Status: None (Preliminary result)    Collection Time: 09/01/20  2:31 AM    Specimen: BLOOD RIGHT HAND   Result Value Ref Range Status    Specimen Description BLOOD RIGHT HAND  Final    Special Requests   Final     BOTTLES DRAWN AEROBIC AND ANAEROBIC Blood Culture adequate volume    Culture   Final     NO GROWTH 2 DAYS  Performed at [facility], [address], [place], [place] [address]     Report Status PENDING  Incomplete       Radiology Studies:  No results found.  Scheduled Meds:  ? vitamin C  500 mg  Oral Daily   ? busPIRone  20 mg Oral TID   ? DULoxetine  60 mg Oral Daily   ? enoxaparin (LOVENOX) injection  60 mg Subcutaneous Q24H   ? methylPREDNISolone (SOLU-MEDROL) injection  60 mg Intravenous Q12H   ? nystatin  5 mL Oral QID   ? pantoprazole  40 mg Oral Daily   ? zinc sulfate  220 mg Oral Daily     Continuous Infusions:  ? azithromycin 500 mg (09/03/20 0121)   ?  cefTRIAXone (ROCEPHIN)  IV 2 g (09/03/20 0331)      LOS: 1 day     Time spent: 30 minutes    Vassie Loll, MD  [facility]    To contact the attending provider between 7A-7P or the covering provider during after hours 7P-7A, please log into the web site [email or web address] and access using universal [facility] password for that web site. If you do not have the password, please call the hospital operator.    09/03/2020, 2:23 PM       Electronically signed by Vassie Loll, MD at 09/03/2020  2:27 PM EST

## 2020-09-03 NOTE — Unmapped (Signed)
Formatting of this note might be different from the original.    Problem: Education:  Goal: Knowledge of General Education information will improve  Description: Including pain rating scale, medication(s)/side effects and non-pharmacologic comfort measures  Outcome: Progressing    Problem: Health Behavior/Discharge Planning:  Goal: Ability to manage health-related needs will improve  Outcome: Progressing    Problem: Clinical Measurements:  Goal: Ability to maintain clinical measurements within normal limits will improve  Outcome: Progressing  Goal: Will remain free from infection  Outcome: Progressing  Goal: Diagnostic test results will improve  Outcome: Progressing  Goal: Respiratory complications will improve  Outcome: Progressing  Goal: Cardiovascular complication will be avoided  Outcome: Progressing    Problem: Activity:  Goal: Risk for activity intolerance will decrease  Outcome: Progressing    Problem: Nutrition:  Goal: Adequate nutrition will be maintained  Outcome: Progressing    Problem: Coping:  Goal: Level of anxiety will decrease  Outcome: Progressing    Problem: Elimination:  Goal: Will not experience complications related to bowel motility  Outcome: Progressing  Goal: Will not experience complications related to urinary retention  Outcome: Progressing    Problem: Skin Integrity:  Goal: Risk for impaired skin integrity will decrease  Outcome: Progressing    Problem: Education:  Goal: Knowledge of risk factors and measures for prevention of condition will improve  Outcome: Progressing    Problem: Safety:  Goal: Ability to remain free from injury will improve  Outcome: Progressing    Problem: Coping:  Goal: Psychosocial and spiritual needs will be supported  Outcome: Progressing    Problem: Respiratory:  Goal: Will maintain a patent airway  Outcome: Progressing  Goal: Complications related to the disease process, condition or treatment will be avoided or minimized  Outcome: Progressing    Electronically  signed by Liborio Nixon, RN at 09/03/2020 10:01 AM EST

## 2020-09-03 NOTE — Unmapped (Signed)
Formatting of this note is different from the original.  Transition of Care [facility]) - Progression Note     Patient Details   Name: Jeremy Park  MRN: [id number]  Date of Birth: 08/29/1965    Transition of Care [facility]) CM/SW Contact   Leitha Bleak, RN  Phone Number:  09/03/2020, 12:14 PM    Clinical Narrative:   Discharge planning, Patient is qualifying for home oxygen. He is requiring increase of 4 L today. [employer] with [employer] is running insurance. We will reassess patient oxygen need tomorrow and update [employer].      Barriers to Discharge: Barriers Resolved    Expected Discharge Plan and Services      Readmission Risk Interventions  Readmission Risk Prevention Plan 09/03/2020   Medication Screening Complete   Transportation Screening Complete   Some recent data might be hidden     Electronically signed by Sheila Oats, RN at 09/03/2020 12:16 PM EST

## 2020-09-03 NOTE — Progress Notes (Signed)
SATURATION QUALIFICATIONS: (This note is used to comply with regulatory documentation for home oxygen)  Patient Saturations on Room Air at Rest = 84%  Patient Saturations on Room Air on exertion = 82%  Patient Saturations on 4 Liters of oxygen while Ambulating = 93%  Please briefly explain why patient needs home oxygen:

## 2020-09-03 NOTE — Progress Notes (Signed)
PROGRESS NOTE    Aaron Soto  QVZ:[id number] DOB: 1965/09/16 DOA: 09/01/2020 PCP: Richardean Chimera, MD   Chief complaint: Shortness of breath, intermittent coughing spells and hypoxia.  Brief Narrative:  As per H&P written by Dr. Robb Matar on 09/01/2020 Aaron Soto is a 55 y.o. male with medical history significant of anxiety, depression, hypertension, class II obesity, history of back surgery who is coming to the emergency department with a history of 9 days of progressively worse viral symptoms which include sore throat, cough of occasionally productive greenish sputum, fatigue, malaise, pleuritic chest pain, abdominal pain, multiple episodes of diarrhea, emesis, decreased appetite and some difficulty sleeping.  He saw his PCP several days ago and was prescribed Paxlovid, but after seeing the medication information sheet he took it back to the pharmacy and did not take it because of potential side effects.  He denies lower extremity edema, dysuria, frequency or hematuria.  No polyuria, polydipsia, polyphagia or blurred vision.  ED Course: Initial vital signs were temperature 100.5 F, pulse 104, respirations 36, BP 145/83 mmHg O2 sat was 87 to 92% on room air.  The patient was placed on nasal cannula O2 at 2 LPM and his oxygen saturation increased to 94 to 96%.  The patient received dexamethasone, ceftriaxone and azithromycin.  He declined remdesivir and interferon treatment.  Assessment & Plan: 1-acute respiratory failure with hypoxia secondary to COVID-19 pneumonia: -Continue treatment with steroids, IV antibiotics and bronchodilators -Continue to wean oxygen supplementation to room air as tolerated; but appears that he will require oxygen to go home. -Continue supportive care, antitussive/mucolytic management. -Patient reports feeling short of breath and more winded with activity. -Requiring 4 L nasal cannula supplementation currently. -Will recheck oxygen desaturation screening in  a.m. -Encourage to continue proning position and to use incentive respirometer/flutter valve. -Continue to follow inflammatory markers.  2-gastroesophageal reflux disease/GI prophylaxis -Continue treatment with PPI.  3-class II obesity -Low calorie diet and portion control has been discussed with patient. -Body mass index is 36.58 kg/m.  4-Anxiety with depression -Mood overall stable -Continue BuSpar and Cymbalta -No hallucinations, no suicidal ideation. -Intermittently anxious when feeling short of breath with activity.  5-Hyponatremia/mild dehydration -In the setting of decreased oral intake -Gentle fluid resuscitation provided at time of admission -Patient tolerating better oral ingestion currently. -Advised to maintain adequate hydration.  6-thrush -Will continue treatment with nystatin.  DVT prophylaxis: Lovenox Code Status: Full code Family Communication: No family at bedside. Disposition:   Status is: Inpatient  Dispo: The patient is from: Home              Anticipated d/c is to: Home              Anticipated d/c date is: 1-2 days              Patient currently no medically ready for discharge; still requiring oxygen supplementation and feeling short winded with minimal activity.  We will continue treatment with steroids, IV antibiotics, titrate down oxygen to room air as tolerated and provide as needed bronchodilator.       Consultants:   None   Procedures:  See below for x-ray reports   Antimicrobials:  Rocephin/Zithromax   Subjective: No fever, no chest pain, no nausea, no vomiting.  Reports feeling more short of breath with activity and experiencing intermittent coughing spells.  Some improvement in his throat discomfort reported.  Patient is requiring 4 L nasal cannula supplementation currently.  Objective: Vitals:   09/03/20 3295  09/03/20 1135 09/03/20 1137 09/03/20 1340  BP: 136/87   134/81  Pulse: 96   97  Resp: 18   19  Temp: 97.8 F  (36.6 C)   98 F (36.7 C)  TempSrc: Oral     SpO2: 98% (!) 87% 93% 96%  Weight:      Height:        Intake/Output Summary (Last 24 hours) at 09/03/2020 1423 Last data filed at 09/03/2020 1300 Gross per 24 hour  Intake 1355 ml  Output 1750 ml  Net -395 ml   Filed Weights   09/01/20 0051  Weight: 119 kg    Examination: General exam: Alert, awake, oriented x 3; afebrile, no chest pain, no nausea, no vomiting.  Patient has ended requiring O2 4 L supplementation today and feeling short of breath with minimal exertion.  He is very apprehensive about going home with oxygen at this time. Respiratory system: Positive rhonchi bilaterally; not using accessory muscles, appreciated tachypnea with exertion, no wheezing. Cardiovascular system:RRR. No murmurs, rubs, gallops. Gastrointestinal system: Abdomen is obese, nondistended, soft and nontender. No organomegaly or masses felt. Normal bowel sounds heard. Central nervous system: Alert and oriented. No focal neurological deficits. Extremities: No C/C/E, +pedal pulses Skin: No petechiae. Psychiatry: Judgement and insight appear normal. Mood & affect appropriate.   Data Reviewed: I have personally reviewed following labs and imaging studies  CBC: Recent Labs  Lab 09/01/20 0230  WBC 7.9  NEUTROABS 6.7  HGB 13.2  HCT 39.7  MCV 85.9  PLT 177    Basic Metabolic Panel: Recent Labs  Lab 09/01/20 0230  NA 126*  K 3.8  CL 95*  CO2 21*  GLUCOSE 110*  BUN 16  CREATININE 1.01  CALCIUM 7.9*    GFR: Estimated Creatinine Clearance: 107.2 mL/min (by C-G formula based on SCr of 1.01 mg/dL).  Liver Function Tests: Recent Labs  Lab 09/01/20 0230  AST 39  ALT 20  ALKPHOS 38  BILITOT 0.6  PROT 6.5  ALBUMIN 3.1*    CBG: No results for input(s): GLUCAP in the last 168 hours.   Recent Results (from the past 240 hour(s))  Resp Panel by RT-PCR (Flu A&B, Covid) Nasopharyngeal Swab     Status: Abnormal   Collection Time: 09/01/20   2:25 AM   Specimen: Nasopharyngeal Swab; Nasopharyngeal(NP) swabs in vial transport medium  Result Value Ref Range Status   SARS Coronavirus 2 by RT PCR POSITIVE (A) NEGATIVE Final    Comment: RESULT CALLED TO, READ BACK BY AND VERIFIED WITH: NICKOLS,K @ 0340 ON 09/01/20 BY JUW (NOTE) SARS-CoV-2 target nucleic acids are DETECTED.  The SARS-CoV-2 RNA is generally detectable in upper respiratory specimens during the acute phase of infection. Positive results are indicative of the presence of the identified virus, but do not rule out bacterial infection or co-infection with other pathogens not detected by the test. Clinical correlation with patient history and other diagnostic information is necessary to determine patient infection status. The expected result is Negative.  Fact Sheet for Patients: BloggerCourse.com  Fact Sheet for Healthcare Providers: SeriousBroker.it  This test is not yet approved or cleared by the [place] FDA and  has been authorized for detection and/or diagnosis of SARS-CoV-2 by FDA under an Emergency Use Authorization (EUA).  This EUA will remain in effect (meaning this test can be  used) for the duration of  the COVID-19 declaration under Section 564(b)(1) of the Act, 21 U.S.C. section 360bbb-3(b)(1), unless the authorization is terminated or revoked  sooner.     Influenza A by PCR NEGATIVE NEGATIVE Final   Influenza B by PCR NEGATIVE NEGATIVE Final    Comment: (NOTE) The Xpert Xpress SARS-CoV-2/FLU/RSV plus assay is intended as an aid in the diagnosis of influenza from Nasopharyngeal swab specimens and should not be used as a sole basis for treatment. Nasal washings and aspirates are unacceptable for Xpert Xpress SARS-CoV-2/FLU/RSV testing.  Fact Sheet for Patients: BloggerCourse.com  Fact Sheet for Healthcare Providers: SeriousBroker.it  This test  is not yet approved or cleared by the [place] FDA and has been authorized for detection and/or diagnosis of SARS-CoV-2 by FDA under an Emergency Use Authorization (EUA). This EUA will remain in effect (meaning this test can be used) for the duration of the COVID-19 declaration under Section 564(b)(1) of the Act, 21 U.S.C. section 360bbb-3(b)(1), unless the authorization is terminated or revoked.  Performed at [facility], [address] [place], [place] [address]   Blood Culture (routine x 2)     Status: None (Preliminary result)   Collection Time: 09/01/20  2:29 AM   Specimen: Right Antecubital; Blood  Result Value Ref Range Status   Specimen Description RIGHT ANTECUBITAL  Final   Special Requests   Final    BOTTLES DRAWN AEROBIC AND ANAEROBIC Blood Culture adequate volume   Culture   Final    NO GROWTH 2 DAYS Performed at [facility], [address], [place], [place] [address]    Report Status PENDING  Incomplete  Blood Culture (routine x 2)     Status: None (Preliminary result)   Collection Time: 09/01/20  2:31 AM   Specimen: BLOOD RIGHT HAND  Result Value Ref Range Status   Specimen Description BLOOD RIGHT HAND  Final   Special Requests   Final    BOTTLES DRAWN AEROBIC AND ANAEROBIC Blood Culture adequate volume   Culture   Final    NO GROWTH 2 DAYS Performed at [facility], [address] [place], [place] [address]    Report Status PENDING  Incomplete     Radiology Studies: No results found. Scheduled Meds: . vitamin C  500 mg Oral Daily  . busPIRone  20 mg Oral TID  . DULoxetine  60 mg Oral Daily  . enoxaparin (LOVENOX) injection  60 mg Subcutaneous Q24H  . methylPREDNISolone (SOLU-MEDROL) injection  60 mg Intravenous Q12H  . nystatin  5 mL Oral QID  . pantoprazole  40 mg Oral Daily  . zinc sulfate  220 mg Oral Daily   Continuous Infusions: . azithromycin 500 mg (09/03/20 0121)  . cefTRIAXone (ROCEPHIN)  IV 2 g (09/03/20 0331)     LOS: 1 day     Time spent: 30 minutes    Vassie Loll, MD [facility]   To contact the attending provider between 7A-7P or the covering provider during after hours 7P-7A, please log into the web site [email or web address] and access using universal [place] password for that web site. If you do not have the password, please call the hospital operator.  09/03/2020, 2:23 PM

## 2020-09-03 NOTE — Plan of Care (Signed)
  Problem: Education: Goal: Knowledge of General Education information will improve Description: Including pain rating scale, medication(s)/side effects and non-pharmacologic comfort measures Outcome: Progressing   Problem: Health Behavior/Discharge Planning: Goal: Ability to manage health-related needs will improve Outcome: Progressing   Problem: Clinical Measurements: Goal: Ability to maintain clinical measurements within normal limits will improve Outcome: Progressing Goal: Will remain free from infection Outcome: Progressing Goal: Diagnostic test results will improve Outcome: Progressing Goal: Respiratory complications will improve Outcome: Progressing Goal: Cardiovascular complication will be avoided Outcome: Progressing   Problem: Activity: Goal: Risk for activity intolerance will decrease Outcome: Progressing   Problem: Nutrition: Goal: Adequate nutrition will be maintained Outcome: Progressing   Problem: Coping: Goal: Level of anxiety will decrease Outcome: Progressing   Problem: Elimination: Goal: Will not experience complications related to bowel motility Outcome: Progressing Goal: Will not experience complications related to urinary retention Outcome: Progressing   Problem: Skin Integrity: Goal: Risk for impaired skin integrity will decrease Outcome: Progressing   Problem: Education: Goal: Knowledge of risk factors and measures for prevention of condition will improve Outcome: Progressing   Problem: Safety: Goal: Ability to remain free from injury will improve Outcome: Progressing   Problem: Coping: Goal: Psychosocial and spiritual needs will be supported Outcome: Progressing   Problem: Respiratory: Goal: Will maintain a patent airway Outcome: Progressing Goal: Complications related to the disease process, condition or treatment will be avoided or minimized Outcome: Progressing

## 2020-09-03 NOTE — TOC Progression Note (Signed)
Transition of Care [facility]) - Progression Note    Patient Details  Name: Aaron Soto MRN: [id number] Date of Birth: [birth year]  Transition [facility] Care [facility]) CM/SW Contact  Leitha Bleak, RN Phone Number: 09/03/2020, 12:14 PM  Clinical Narrative:   Discharge planning, Patient is qualifying for home oxygen. He is requiring increase of 4 L today. [employer] with [employer] is running insurance. We will reassess patient oxygen need tomorrow and update [employer].    Barriers to Discharge: Barriers Resolved  Expected Discharge Plan and Services      Readmission Risk Interventions Readmission Risk Prevention Plan 09/03/2020  Medication Screening Complete  Transportation Screening Complete  Some recent data might be hidden

## 2020-09-04 DIAGNOSIS — U071 COVID-19: Secondary | ICD-10-CM | POA: Diagnosis not present

## 2020-09-04 DIAGNOSIS — F418 Other specified anxiety disorders: Secondary | ICD-10-CM | POA: Diagnosis not present

## 2020-09-04 DIAGNOSIS — E871 Hypo-osmolality and hyponatremia: Secondary | ICD-10-CM | POA: Diagnosis not present

## 2020-09-04 DIAGNOSIS — J9601 Acute respiratory failure with hypoxia: Secondary | ICD-10-CM | POA: Diagnosis not present

## 2020-09-04 LAB — C-REACTIVE PROTEIN: CRP: 1 mg/dL — ABNORMAL HIGH (ref <1.0)

## 2020-09-04 MED ORDER — TRAZODONE HCL 50 MG PO TABS: 75.0000 mg | ORAL_TABLET | Freq: Every evening | ORAL | Status: DC | PRN

## 2020-09-04 MED ORDER — AMOXICILLIN-POT CLAVULANATE 875-125 MG PO TABS
1.0000 | ORAL_TABLET | Freq: Two times a day (BID) | ORAL | Status: DC
  Administered 2020-09-04 – 2020-09-05 (×2): 1 via ORAL
  Filled 2020-09-04 (×2): qty 1

## 2020-09-04 NOTE — Progress Notes (Signed)
Formatting of this note is different from the original.  Images from the original note were not included.    PROGRESS NOTE    Jeremy Park  ZOX:[id number] DOB: 07-02-1966 DOA: 09/01/2020  PCP: Richardean Chimera, MD     Chief complaint: Shortness of breath, intermittent coughing spells and hypoxia.    Brief Narrative:   As per H&P written by Dr. Robb Matar on 09/01/2020  Jeremy Park is a 55 y.o. male with medical history significant of anxiety, depression, hypertension, class II obesity, history of back surgery who is coming to the emergency department with a history of 9 days of progressively worse viral symptoms which include sore throat, cough of occasionally productive greenish sputum, fatigue, malaise, pleuritic chest pain, abdominal pain, multiple episodes of diarrhea, emesis, decreased appetite and some difficulty sleeping.  He saw his PCP several days ago and was prescribed Paxlovid, but after seeing the medication information sheet he took it back to the pharmacy and did not take it because of potential side effects.  He denies lower extremity edema, dysuria, frequency or hematuria.  No polyuria, polydipsia, polyphagia or blurred vision.    ED Course: Initial vital signs were temperature 100.5 F, pulse 104, respirations 36, BP 145/83 mmHg O2 sat was 87 to 92% on room air.  The patient was placed on nasal cannula O2 at 2 LPM and his oxygen saturation increased to 94 to 96%.  The patient received dexamethasone, ceftriaxone and azithromycin.  He declined remdesivir and interferon treatment.    Assessment & Plan:  1-acute respiratory failure with hypoxia secondary to COVID-19 pneumonia:  -Continue antitussive/mucolytic management.  -Patient reports feeling short of breath and very winded with activity.  -Requiring 3.5 L nasal cannula supplementation currently.  -Will reassess desaturation screening  -Transition antibiotics to oral regimen  -Continue IV steroids, bronchodilators and supportive care.  -Encourage to  continue proning position and to use incentive respirometer/flutter valve.  -Continue to follow inflammatory markers.    2-gastroesophageal reflux disease/GI prophylaxis  -Continue treatment with PPI.    3-class II obesity  -Low calorie diet and portion control has been discussed with patient.  -Body mass index is 36.58 kg/m.    4-Anxiety with depression  -Mood overall stable  -Continue BuSpar and Cymbalta  -No hallucinations, no suicidal ideation.  -Intermittently anxious when feeling short of breath with activity.    5-Hyponatremia/mild dehydration  -In the setting of decreased oral intake  -Gentle fluid resuscitation provided at time of admission  -Patient tolerating better oral ingestion currently.  -Advised to maintain adequate hydration.    6-thrush  -Will continue treatment with nystatin.    7-insomnia  -will add low dose trazodone to be use das needed for insomnia.     DVT prophylaxis: Lovenox  Code Status: Full code  Family Communication: No family at bedside.  Disposition:     Status is: Inpatient    Dispo: The patient is from: Home               Anticipated d/c is to: Home               Anticipated d/c date is: 1 day               Patient currently no medically ready for discharge; still requiring oxygen supplementation and feeling short winded with minimal activity.  Continue treatment with a steroid; transition antibiotics to oral regimen.  Continue proning position, incentive spirometry and flutter valve.  Reassess desaturation screening.  Wean off oxygen supplementation as tolerated.      Consultants:    None     Procedures:   See below for x-ray reports     Antimicrobials:   Rocephin/Zithromax    Subjective:  No fever, no chest pain, no nausea, no vomiting.  Rough night secondary to shortness of breath, intermittent productive coughing spells unsure when the sensation.  Continues to require 3.5 to 4 L nasal cannula supplementation.  No fever.    Objective:  Vitals:    09/03/20 1137 09/03/20 1340  09/03/20 2009 09/04/20 0545   BP:  134/81 (!) 148/94 (!) 143/91   Pulse:  97 90 94   Resp:  19 18 (!) 8   Temp:  98 F (36.7 C) 97.8 F (36.6 C) 98.8 F (37.1 C)   TempSrc:   Oral Oral   SpO2: 93% 96% 98% 100%   Weight:       Height:         Intake/Output Summary (Last 24 hours) at 09/04/2020 1259  Last data filed at 09/04/2020 0900  Gross per 24 hour   Intake 840 ml   Output 2550 ml   Net -1710 ml     Filed Weights    09/01/20 0051   Weight: 119 kg     Examination:  General exam: Alert, awake, oriented x 3; reporting intermittent coughing spells, rough night due to shortness of breath and insomnia.  Still using 3.5 to 4 L nasal cannula supplementation and is expressing short winded sensation with minimal activity.  Respiratory system: Positive scattered rhonchi; not using accessory muscles.  Mild tachypnea reported on vital signs.  Cardiovascular system:RRR. No murmurs, rubs, gallops.  Unable to properly assess JVD.  Gastrointestinal system: Abdomen is obese, nondistended, soft and nontender. No organomegaly or masses felt. Normal bowel sounds heard.  Central nervous system: Alert and oriented. No focal neurological deficits.  Extremities: No cyanosis or clubbing.  Skin: No rashes, no petechiae.  Psychiatry: Judgement and insight appear normal. Mood & affect appropriate.     Data Reviewed: I have personally reviewed following labs and imaging studies    CBC:  Recent Labs   Lab 09/01/20  0230   WBC 7.9   NEUTROABS 6.7   HGB 13.2   HCT 39.7   MCV 85.9   PLT 177     Basic Metabolic Panel:  Recent Labs   Lab 09/01/20  0230   NA 126*   K 3.8   CL 95*   CO2 21*   GLUCOSE 110*   BUN 16   CREATININE 1.01   CALCIUM 7.9*     GFR:  Estimated Creatinine Clearance: 107.2 mL/min (by C-G formula based on SCr of 1.01 mg/dL).    Liver Function Tests:  Recent Labs   Lab 09/01/20  0230   AST 39   ALT 20   ALKPHOS 38   BILITOT 0.6   PROT 6.5   ALBUMIN 3.1*     CBG:  No results for input(s): GLUCAP in the last 168 hours.    Recent  Results (from the past 240 hour(s))   Resp Panel by RT-PCR (Flu A&B, Covid) Nasopharyngeal Swab     Status: Abnormal    Collection Time: 09/01/20  2:25 AM    Specimen: Nasopharyngeal Swab; Nasopharyngeal(NP) swabs in vial transport medium   Result Value Ref Range Status    SARS Coronavirus 2 by RT PCR POSITIVE (A) NEGATIVE Final     Comment: RESULT CALLED  TO, READ BACK BY AND VERIFIED WITH:  NICKOLS,K @ 0340 ON 09/01/20 BY JUW  (NOTE)  SARS-CoV-2 target nucleic acids are DETECTED.    The SARS-CoV-2 RNA is generally detectable in upper respiratory  specimens during the acute phase of infection. Positive results are  indicative of the presence of the identified virus, but do not rule  out bacterial infection or co-infection with other pathogens not  detected by the test. Clinical correlation with patient history and  other diagnostic information is necessary to determine patient  infection status. The expected result is Negative.    Fact Sheet for Patients:  BloggerCourse.com    Fact Sheet for Healthcare Providers:  SeriousBroker.it    This test is not yet approved or cleared by the [place] FDA and   has been authorized for detection and/or diagnosis of SARS-CoV-2 by  FDA under an Emergency Use Authorization (EUA).  This EUA will  remain in effect (meaning this test can be  used) for the duration of   the COVID-19 declaration under Section 564(b)(1) of the Act, 21  U.S.C. section 360bbb-3(b)(1), unless the authorization is  terminated or revoked sooner.       Influenza A by PCR NEGATIVE NEGATIVE Final    Influenza B by PCR NEGATIVE NEGATIVE Final     Comment: (NOTE)  The Xpert Xpress SARS-CoV-2/FLU/RSV plus assay is intended as an aid  in the diagnosis of influenza from Nasopharyngeal swab specimens and  should not be used as a sole basis for treatment. Nasal washings and  aspirates are unacceptable for Xpert Xpress SARS-CoV-2/FLU/RSV  testing.    Fact Sheet for  Patients:  BloggerCourse.com    Fact Sheet for Healthcare Providers:  SeriousBroker.it    This test is not yet approved or cleared by the [place] FDA and  has been authorized for detection and/or diagnosis of SARS-CoV-2 by  FDA under an Emergency Use Authorization (EUA). This EUA will remain  in effect (meaning this test can be used) for the duration of the  COVID-19 declaration under Section 564(b)(1) of the Act, 21 U.S.C.  section 360bbb-3(b)(1), unless the authorization is terminated or  revoked.    Performed at [facility], [address], [place], [place] [address]    Blood Culture (routine x 2)     Status: None (Preliminary result)    Collection Time: 09/01/20  2:29 AM    Specimen: Right Antecubital; Blood   Result Value Ref Range Status    Specimen Description RIGHT ANTECUBITAL  Final    Special Requests   Final     BOTTLES DRAWN AEROBIC AND ANAEROBIC Blood Culture adequate volume    Culture   Final     NO GROWTH 3 DAYS  Performed at [facility], [address], [place], [place] [address]     Report Status PENDING  Incomplete   Blood Culture (routine x 2)     Status: None (Preliminary result)    Collection Time: 09/01/20  2:31 AM    Specimen: BLOOD RIGHT HAND   Result Value Ref Range Status    Specimen Description BLOOD RIGHT HAND  Final    Special Requests   Final     BOTTLES DRAWN AEROBIC AND ANAEROBIC Blood Culture adequate volume    Culture   Final     NO GROWTH 3 DAYS  Performed at [facility], [address], [place], [place] [address]     Report Status PENDING  Incomplete  Radiology Studies:  No results found.  Scheduled Meds:  ? amoxicillin-clavulanate  1 tablet Oral Q12H   ? vitamin C  500 mg Oral Daily   ? busPIRone  20 mg Oral TID   ? DULoxetine  60 mg Oral Daily   ? enoxaparin (LOVENOX) injection  60 mg Subcutaneous Q24H   ? methylPREDNISolone (SOLU-MEDROL) injection  60 mg Intravenous Q12H   ? nystatin  5 mL Oral QID   ? pantoprazole  40  mg Oral Daily   ? zinc sulfate  220 mg Oral Daily     Continuous Infusions:     LOS: 2 days     Time spent: 30 minutes    Vassie Loll, MD  [facility]    To contact the attending provider between 7A-7P or the covering provider during after hours 7P-7A, please log into the web site [email or web address] and access using universal [facility] password for that web site. If you do not have the password, please call the hospital operator.    09/04/2020, 12:59 PM       Electronically signed by Vassie Loll, MD at 09/04/2020  1:15 PM EST

## 2020-09-04 NOTE — Progress Notes (Signed)
Formatting of this note might be different from the original.  SATURATION QUALIFICATIONS: (This note is used to comply with regulatory documentation for home oxygen)    Patient Saturations on Room Air at Rest = 94%    Patient Saturations on Room Air while Ambulating = 84%    Patient Saturations on 4 Liters of oxygen while Ambulating = 90%    Please briefly explain why patient needs home oxygen: Patient requires 4L of O2 while ambulating and still O2 saturation is only 90%.  Patient becomes short of breath even on the oxygen.    Electronically signed by Carole Civil, RN at 09/04/2020  1:59 PM EST

## 2020-09-04 NOTE — Progress Notes (Signed)
PROGRESS NOTE    Aaron Soto  BOF:[id number] DOB: Jun 27, 1966 DOA: 09/01/2020 PCP: Richardean Chimera, MD   Chief complaint: Shortness of breath, intermittent coughing spells and hypoxia.  Brief Narrative:  As per H&P written by Dr. Robb Matar on 09/01/2020 Aaron Soto is a 55 y.o. male with medical history significant of anxiety, depression, hypertension, class II obesity, history of back surgery who is coming to the emergency department with a history of 9 days of progressively worse viral symptoms which include sore throat, cough of occasionally productive greenish sputum, fatigue, malaise, pleuritic chest pain, abdominal pain, multiple episodes of diarrhea, emesis, decreased appetite and some difficulty sleeping.  He saw his PCP several days ago and was prescribed Paxlovid, but after seeing the medication information sheet he took it back to the pharmacy and did not take it because of potential side effects.  He denies lower extremity edema, dysuria, frequency or hematuria.  No polyuria, polydipsia, polyphagia or blurred vision.  ED Course: Initial vital signs were temperature 100.5 F, pulse 104, respirations 36, BP 145/83 mmHg O2 sat was 87 to 92% on room air.  The patient was placed on nasal cannula O2 at 2 LPM and his oxygen saturation increased to 94 to 96%.  The patient received dexamethasone, ceftriaxone and azithromycin.  He declined remdesivir and interferon treatment.  Assessment & Plan: 1-acute respiratory failure with hypoxia secondary to COVID-19 pneumonia: -Continue antitussive/mucolytic management. -Patient reports feeling short of breath and very winded with activity. -Requiring 3.5 L nasal cannula supplementation currently. -Will reassess desaturation screening -Transition antibiotics to oral regimen -Continue IV steroids, bronchodilators and supportive care. -Encourage to continue proning position and to use incentive respirometer/flutter valve. -Continue to follow inflammatory  markers.  2-gastroesophageal reflux disease/GI prophylaxis -Continue treatment with PPI.  3-class II obesity -Low calorie diet and portion control has been discussed with patient. -Body mass index is 36.58 kg/m.  4-Anxiety with depression -Mood overall stable -Continue BuSpar and Cymbalta -No hallucinations, no suicidal ideation. -Intermittently anxious when feeling short of breath with activity.  5-Hyponatremia/mild dehydration -In the setting of decreased oral intake -Gentle fluid resuscitation provided at time of admission -Patient tolerating better oral ingestion currently. -Advised to maintain adequate hydration.  6-thrush -Will continue treatment with nystatin.  7-insomnia -will add low dose trazodone to be use das needed for insomnia.   DVT prophylaxis: Lovenox Code Status: Full code Family Communication: No family at bedside. Disposition:   Status is: Inpatient  Dispo: The patient is from: Home              Anticipated d/c is to: Home              Anticipated d/c date is: 1 day              Patient currently no medically ready for discharge; still requiring oxygen supplementation and feeling short winded with minimal activity.  Continue treatment with a steroid; transition antibiotics to oral regimen.  Continue proning position, incentive spirometry and flutter valve.  Reassess desaturation screening.  Wean off oxygen supplementation as tolerated.       Consultants:   None   Procedures:  See below for x-ray reports   Antimicrobials:  Rocephin/Zithromax   Subjective: No fever, no chest pain, no nausea, no vomiting.  Rough night secondary to shortness of breath, intermittent productive coughing spells unsure when the sensation.  Continues to require 3.5 to 4 L nasal cannula supplementation.  No fever.  Objective: Vitals:   09/03/20  1137 09/03/20 1340 09/03/20 2009 09/04/20 0545  BP:  134/81 (!) 148/94 (!) 143/91  Pulse:  97 90 94  Resp:  19 18  (!) 8  Temp:  98 F (36.7 C) 97.8 F (36.6 C) 98.8 F (37.1 C)  TempSrc:   Oral Oral  SpO2: 93% 96% 98% 100%  Weight:      Height:        Intake/Output Summary (Last 24 hours) at 09/04/2020 1259 Last data filed at 09/04/2020 0900 Gross per 24 hour  Intake 840 ml  Output 2550 ml  Net -1710 ml   Filed Weights   09/01/20 0051  Weight: 119 kg    Examination: General exam: Alert, awake, oriented x 3; reporting intermittent coughing spells, rough night due to shortness of breath and insomnia.  Still using 3.5 to 4 L nasal cannula supplementation and is expressing short winded sensation with minimal activity. Respiratory system: Positive scattered rhonchi; not using accessory muscles.  Mild tachypnea reported on vital signs. Cardiovascular system:RRR. No murmurs, rubs, gallops.  Unable to properly assess JVD. Gastrointestinal system: Abdomen is obese, nondistended, soft and nontender. No organomegaly or masses felt. Normal bowel sounds heard. Central nervous system: Alert and oriented. No focal neurological deficits. Extremities: No cyanosis or clubbing. Skin: No rashes, no petechiae. Psychiatry: Judgement and insight appear normal. Mood & affect appropriate.    Data Reviewed: I have personally reviewed following labs and imaging studies  CBC: Recent Labs  Lab 09/01/20 0230  WBC 7.9  NEUTROABS 6.7  HGB 13.2  HCT 39.7  MCV 85.9  PLT 177    Basic Metabolic Panel: Recent Labs  Lab 09/01/20 0230  NA 126*  K 3.8  CL 95*  CO2 21*  GLUCOSE 110*  BUN 16  CREATININE 1.01  CALCIUM 7.9*    GFR: Estimated Creatinine Clearance: 107.2 mL/min (by C-G formula based on SCr of 1.01 mg/dL).  Liver Function Tests: Recent Labs  Lab 09/01/20 0230  AST 39  ALT 20  ALKPHOS 38  BILITOT 0.6  PROT 6.5  ALBUMIN 3.1*    CBG: No results for input(s): GLUCAP in the last 168 hours.   Recent Results (from the past 240 hour(s))  Resp Panel by RT-PCR (Flu A&B, Covid)  Nasopharyngeal Swab     Status: Abnormal   Collection Time: 09/01/20  2:25 AM   Specimen: Nasopharyngeal Swab; Nasopharyngeal(NP) swabs in vial transport medium  Result Value Ref Range Status   SARS Coronavirus 2 by RT PCR POSITIVE (A) NEGATIVE Final    Comment: RESULT CALLED TO, READ BACK BY AND VERIFIED WITH: NICKOLS,K @ 0340 ON 09/01/20 BY JUW (NOTE) SARS-CoV-2 target nucleic acids are DETECTED.  The SARS-CoV-2 RNA is generally detectable in upper respiratory specimens during the acute phase of infection. Positive results are indicative of the presence of the identified virus, but do not rule out bacterial infection or co-infection with other pathogens not detected by the test. Clinical correlation with patient history and other diagnostic information is necessary to determine patient infection status. The expected result is Negative.  Fact Sheet for Patients: BloggerCourse.com  Fact Sheet for Healthcare Providers: SeriousBroker.it  This test is not yet approved or cleared by the [place] FDA and  has been authorized for detection and/or diagnosis of SARS-CoV-2 by FDA under an Emergency Use Authorization (EUA).  This EUA will remain in effect (meaning this test can be  used) for the duration of  the COVID-19 declaration under Section 564(b)(1) of the Act, 21 U.S.C.  section 360bbb-3(b)(1), unless the authorization is terminated or revoked sooner.     Influenza A by PCR NEGATIVE NEGATIVE Final   Influenza B by PCR NEGATIVE NEGATIVE Final    Comment: (NOTE) The Xpert Xpress SARS-CoV-2/FLU/RSV plus assay is intended as an aid in the diagnosis of influenza from Nasopharyngeal swab specimens and should not be used as a sole basis for treatment. Nasal washings and aspirates are unacceptable for Xpert Xpress SARS-CoV-2/FLU/RSV testing.  Fact Sheet for Patients: BloggerCourse.com  Fact Sheet for  Healthcare Providers: SeriousBroker.it  This test is not yet approved or cleared by the [place] FDA and has been authorized for detection and/or diagnosis of SARS-CoV-2 by FDA under an Emergency Use Authorization (EUA). This EUA will remain in effect (meaning this test can be used) for the duration of the COVID-19 declaration under Section 564(b)(1) of the Act, 21 U.S.C. section 360bbb-3(b)(1), unless the authorization is terminated or revoked.  Performed at [facility], [address] [place], [place] [address]   Blood Culture (routine x 2)     Status: None (Preliminary result)   Collection Time: 09/01/20  2:29 AM   Specimen: Right Antecubital; Blood  Result Value Ref Range Status   Specimen Description RIGHT ANTECUBITAL  Final   Special Requests   Final    BOTTLES DRAWN AEROBIC AND ANAEROBIC Blood Culture adequate volume   Culture   Final    NO GROWTH 3 DAYS Performed at [facility], [address] [place], [place] [address]    Report Status PENDING  Incomplete  Blood Culture (routine x 2)     Status: None (Preliminary result)   Collection Time: 09/01/20  2:31 AM   Specimen: BLOOD RIGHT HAND  Result Value Ref Range Status   Specimen Description BLOOD RIGHT HAND  Final   Special Requests   Final    BOTTLES DRAWN AEROBIC AND ANAEROBIC Blood Culture adequate volume   Culture   Final    NO GROWTH 3 DAYS Performed at [facility], [address], [place], [place] [address]    Report Status PENDING  Incomplete     Radiology Studies: No results found. Scheduled Meds: . amoxicillin-clavulanate  1 tablet Oral Q12H  . vitamin C  500 mg Oral Daily  . busPIRone  20 mg Oral TID  . DULoxetine  60 mg Oral Daily  . enoxaparin (LOVENOX) injection  60 mg Subcutaneous Q24H  . methylPREDNISolone (SOLU-MEDROL) injection  60 mg Intravenous Q12H  . nystatin  5 mL Oral QID  . pantoprazole  40 mg Oral Daily  . zinc sulfate  220 mg Oral Daily   Continuous  Infusions:    LOS: 2 days    Time spent: 30 minutes    Vassie Loll, MD [facility]   To contact the attending provider between 7A-7P or the covering provider during after hours 7P-7A, please log into the web site [email or web address] and access using universal [place] password for that web site. If you do not have the password, please call the hospital operator.  09/04/2020, 12:59 PM

## 2020-09-04 NOTE — Progress Notes (Signed)
SATURATION QUALIFICATIONS: (This note is used to comply with regulatory documentation for home oxygen)  Patient Saturations on Room Air at Rest = 94%  Patient Saturations on Room Air while Ambulating = 84%  Patient Saturations on 4 Liters of oxygen while Ambulating = 90%  Please briefly explain why patient needs home oxygen: Patient requires 4L of O2 while ambulating and still O2 saturation is only 90%.  Patient becomes short of breath even on the oxygen.

## 2020-09-05 DIAGNOSIS — U071 COVID-19: Secondary | ICD-10-CM | POA: Diagnosis not present

## 2020-09-05 DIAGNOSIS — I10 Essential (primary) hypertension: Secondary | ICD-10-CM | POA: Diagnosis not present

## 2020-09-05 DIAGNOSIS — J181 Lobar pneumonia, unspecified organism: Secondary | ICD-10-CM

## 2020-09-05 DIAGNOSIS — J9601 Acute respiratory failure with hypoxia: Secondary | ICD-10-CM | POA: Diagnosis not present

## 2020-09-05 DIAGNOSIS — F418 Other specified anxiety disorders: Secondary | ICD-10-CM | POA: Diagnosis not present

## 2020-09-05 LAB — CBC
HCT: 43.9 % (ref 39.0–52.0)
Hemoglobin: 14.2 g/dL (ref 13.0–17.0)
MCH: 28.7 pg (ref 26.0–34.0)
MCHC: 32.3 g/dL (ref 30.0–36.0)
MCV: 88.7 fL (ref 80.0–100.0)
Platelets: 308 10*3/uL (ref 150–400)
RBC: 4.95 MIL/uL (ref 4.22–5.81)
RDW: 12.6 % (ref 11.5–15.5)
WBC: 11.1 10*3/uL — ABNORMAL HIGH (ref 4.0–10.5)
nRBC: 0 % (ref 0.0–0.2)

## 2020-09-05 LAB — COMPREHENSIVE METABOLIC PANEL
ALT: 46 U/L — ABNORMAL HIGH (ref 0–44)
AST: 36 U/L (ref 15–41)
Albumin: 2.8 g/dL — ABNORMAL LOW (ref 3.5–5.0)
Alkaline Phosphatase: 52 U/L (ref 38–126)
Anion gap: 8 (ref 5–15)
BUN: 22 mg/dL — ABNORMAL HIGH (ref 6–20)
CO2: 28 mmol/L (ref 22–32)
Calcium: 8.2 mg/dL — ABNORMAL LOW (ref 8.9–10.3)
Chloride: 98 mmol/L (ref 98–111)
Creatinine, Ser: 0.92 mg/dL (ref 0.61–1.24)
GFR, Estimated: >60 mL/min (ref >60)
Glucose, Bld: 113 mg/dL — ABNORMAL HIGH (ref 70–99)
Potassium: 4.4 mmol/L (ref 3.5–5.1)
Sodium: 134 mmol/L — ABNORMAL LOW (ref 135–145)
Total Bilirubin: 0.6 mg/dL (ref 0.3–1.2)
Total Protein: 6.3 g/dL — ABNORMAL LOW (ref 6.5–8.1)

## 2020-09-05 LAB — MAGNESIUM: Magnesium: 2.1 mg/dL (ref 1.7–2.4)

## 2020-09-05 MED ORDER — ASCORBIC ACID 500 MG PO TABS: 500.0000 mg | ORAL_TABLET | Freq: Every day | ORAL | 1 refills | Status: DC

## 2020-09-05 MED ORDER — AMOXICILLIN-POT CLAVULANATE 875-125 MG PO TABS: 1.0000 | ORAL_TABLET | Freq: Two times a day (BID) | ORAL | 0 refills | Status: AC

## 2020-09-05 MED ORDER — PREDNISONE 20 MG PO TABS: ORAL_TABLET | ORAL | 0 refills | Status: DC

## 2020-09-05 MED ORDER — NYSTATIN 100000 UNIT/ML MT SUSP: 5.0000 mL | Freq: Four times a day (QID) | OROMUCOSAL | 0 refills | Status: AC

## 2020-09-05 MED ORDER — IPRATROPIUM-ALBUTEROL 20-100 MCG/ACT IN AERS: 1.0000 | INHALATION_SPRAY | Freq: Four times a day (QID) | RESPIRATORY_TRACT | 1 refills | Status: DC | PRN

## 2020-09-05 MED ORDER — ZINC SULFATE 220 (50 ZN) MG PO CAPS: 220.0000 mg | ORAL_CAPSULE | Freq: Every day | ORAL | 1 refills | Status: DC

## 2020-09-05 MED ORDER — GUAIFENESIN-DM 100-10 MG/5ML PO SYRP: 5.0000 mL | ORAL_SOLUTION | ORAL | 0 refills | Status: DC | PRN

## 2020-09-05 MED ORDER — NYSTATIN 100000 UNIT/ML MT SUSP: 5.0000 mL | Freq: Four times a day (QID) | OROMUCOSAL | 0 refills | Status: DC

## 2020-09-05 MED ORDER — OMEPRAZOLE 20 MG PO CPDR: 40.0000 mg | DELAYED_RELEASE_CAPSULE | Freq: Every day | ORAL | 1 refills | Status: AC

## 2020-09-05 MED ORDER — OMEPRAZOLE 20 MG PO CPDR: 40.0000 mg | DELAYED_RELEASE_CAPSULE | Freq: Every day | ORAL | 1 refills | Status: DC

## 2020-09-05 MED ORDER — AMOXICILLIN-POT CLAVULANATE 875-125 MG PO TABS: 1.0000 | ORAL_TABLET | Freq: Two times a day (BID) | ORAL | 0 refills | Status: DC

## 2020-09-05 NOTE — Unmapped (Signed)
Formatting of this note is different from the original.  Transition of Care [facility]) - CM/SW Discharge Note    Patient Details   Name: AARON BOSTWICK  MRN: [id number]  Date of Birth: 1966-07-26    Transition of Care [facility]) CM/SW Contact:   Leitha Bleak, RN  Phone Number:  09/05/2020, 10:49 AM    Clinical Narrative:   Patient will discharge home today with home oxygen. Choices given, [facility] with [employer] accepted the referral and will deliver to 300. Orders and qualifying note completed.    Final next level of care: Home/Self Care  Barriers to Discharge: Barriers Resolved    Patient Goals and CMS Choice  Patient states their goals for this hospitalization and ongoing recovery are:: to go home.  CMS Medicare.gov Compare Post Acute Care list provided to:: Patient      Discharge Placement      Patient and family notified of of transfer: 09/05/20    Discharge Plan and Services    DME Arranged: Oxygen  DME Agency: [employer]  Date DME Agency Contacted: 09/05/20  Time DME Agency Contacted: 1049  Representative spoke with at DME Agency: [facility]      Readmission Risk Interventions  Readmission Risk Prevention Plan 09/03/2020   Medication Screening Complete   Transportation Screening Complete   Some recent data might be hidden       Electronically signed by Sheila Oats, RN at 09/05/2020 10:51 AM EST

## 2020-09-05 NOTE — Discharge Summary (Signed)
Formatting of this note is different from the original.  Physician Discharge Summary   Jeremy Park RUE:[id number] DOB: 21-Mar-1966 DOA: 09/01/2020    PCP: Richardean Chimera, MD    Admit date: 09/01/2020  Discharge date: 09/05/2020    Time spent: 35 minutes    Recommendations for Outpatient Follow-up:   1. Repeat basic metabolic panel to follow electrolytes and renal function  2. Reassess blood pressure and adjust antihypertensive regimen as needed  3. Repeat chest x-ray in 6 weeks to assure complete resolution of infiltrates  4. Continue assisting patient with weight management.  5. Reassess the need for oxygen supplementation.    Discharge Diagnoses:   Principal Problem:    Pneumonia due to COVID-19 virus  Active Problems:    Hypertension    Anxiety with depression    Hyponatremia    Protein calorie malnutrition (HCC)    Acute respiratory failure with hypoxia (HCC)    Lobar pneumonia (HCC)    Discharge Condition: Stable and improved.  Discharged home with home health oxygen and instruction to follow-up with PCP in 2 weeks.    CODE STATUS: Full code    Diet recommendation: Low calorie/heart healthy diet.    Filed Weights    09/01/20 0051   Weight: 119 kg     History of present illness:   As per H&P written by Dr. Robb Matar on 09/01/2020  [name] a 54 y.o.malewith medical history significant ofanxiety, depression, hypertension, class II obesity, history of back surgery who is coming to the emergency department with a history of 9 days of progressively worse viral symptoms which include sore throat, cough of occasionally productive greenish sputum, fatigue, malaise, pleuritic chest pain, abdominal pain, multiple episodes of diarrhea, emesis, decreased appetite and some difficulty sleeping. He saw his PCP several days ago and was prescribed Paxlovid, but after seeing the medication information sheet he took it back to the pharmacy and did not take it because of potential side effects. He denies lower extremity edema,  dysuria, frequency or hematuria. No polyuria, polydipsia, polyphagia or blurred vision.    ED Course:Initial vital signs were temperature100.5 F, pulse 104, respirations 36, BP 145/83 mmHg O2 sat was 87 to 92% on room air. The patient was placed on nasal cannula O2 at 2 LPM and his oxygen saturation increased to 94 to 96%.The patient received dexamethasone, ceftriaxone and azithromycin. He declined remdesivir and interferon treatment.    Hospital Course:   1-acute respiratory failure with hypoxia secondary to COVID-19 pneumonia:  -Continue as needed bronchodilators and antitussive management.  -Patient reports feeling much better and ready to go home; still mildly short winded with activity and experiencing intermittent coughing spells.  -On desaturation screening found to require 4 L nasal cannula Supplementation; home health oxygen has been arranged at discharge.  -Patient has been discharged with instructions for steroids tapering  -Given elevated procalcitonin level and lobe infiltrates patient was treated with antibiotics for bacterial pneumonia component; 4 more days of oral antibiotics to complete treatment prescribed.  -Encourage to continue proning position and to use incentive respirometer/flutter valve.    2-gastroesophageal reflux disease/GI prophylaxis  -Continue treatment with PPI.    3-class II obesity  -Low calorie diet and portion control has been discussed with patient.  -Body mass index is 36.58 kg/m.    4-Anxiety with depression  -Mood overall stable  -Continue BuSpar and Cymbalta  -No hallucinations, no suicidal ideation.    5-Hyponatremia/mild dehydration  -In the setting of decreased oral  intake and acute pneumonia.  -Improved/resolved with fluid resuscitation  -Patient advised to maintain adequate oral hydration    6-thrush  -Will continue treatment with nystatin.  -Patient advised to maintain adequate oral hygiene.    7-insomnia  -As needed trazodone was used while  hospitalized.    8-essential hypertension  -Overall stable and well-controlled  -Resume home antihypertensive agents  -Heart healthy diet encouraged.    Procedures:  See below for x-ray reports    Consultations:   None    Discharge Exam:  Vitals:    09/04/20 2045 09/05/20 0513   BP: 121/76 123/85   Pulse: 87 96   Resp: 20 20   Temp: (!) 97.4 F (36.3 C) 98 F (36.7 C)   SpO2: 94% 97%     General: No chest pain, no nausea, no vomiting, no fever.  Still feeling short winded with activity, but improved.  Patient found to require 4 L nasal cannula supplementation at discharge to maintain stable oxygen saturation.  Cardiovascular: S1 and S2, no rubs, no gallops, no JVD, no murmur.  Respiratory: Improved air movement bilaterally; no wheezing, no using accessory muscles.  Positive scattered rhonchi appreciated on examination.  Abdomen: Obese, soft, nontender, distended, positive bowel sounds  Extremities: No cyanosis, no clubbing.    Discharge Instructions    Discharge Instructions     Discharge instructions   Complete by: As directed     Take medications as prescribed  Maintain adequate hydration   Arrange follow-up with PCP in 2 weeks  Use oxygen as instructed  Follow low calorie diet.  Increase physical activity gradually  Continue to follow the 3 W's: Wait 6 feet apart (social distance), Wash hands frequently and Wear a mask  Continue to follow 10 more days of isolation.       Allergies as of 09/05/2020    No Known Allergies      Medication List     STOP taking these medications    ibuprofen 200 MG tablet  Commonly known as: ADVIL    ipratropium 0.06 % nasal spray  Commonly known as: ATROVENT    ketoprofen 75 MG capsule  Commonly known as: ORUDIS    nirmatrelvir/ritonavir EUA Tabs  Commonly known as: PAXLOVID    OVER THE COUNTER MEDICATION      TAKE these medications    amoxicillin-clavulanate 875-125 MG tablet  Commonly known as: AUGMENTIN  Take 1 tablet by mouth every 12 (twelve) hours for 4 days.    ascorbic acid  500 MG tablet  Commonly known as: VITAMIN C  Take 1 tablet (500 mg total) by mouth daily.  Start taking on: September 06, 2020    benzonatate 100 MG capsule  Commonly known as: TESSALON  Take 100 mg by mouth 3 (three) times daily.    busPIRone 10 MG tablet  Commonly known as: BUSPAR  Take 20 mg by mouth 3 (three) times daily.    celecoxib 200 MG capsule  Commonly known as: CELEBREX  Take 200 mg by mouth daily.    cyclobenzaprine 10 MG tablet  Commonly known as: FLEXERIL  Take 10 mg by mouth daily.    DULoxetine 60 MG capsule  Commonly known as: CYMBALTA  Take 60 mg by mouth 2 (two) times daily.    guaiFENesin-dextromethorphan 100-10 MG/5ML syrup  Commonly known as: ROBITUSSIN DM  Take 5 mLs by mouth every 4 (four) hours as needed for cough.    imipramine 10 MG tablet  Commonly known as: TOFRANIL  Take 30 mg by mouth at bedtime.    Ipratropium-Albuterol 20-100 MCG/ACT Aers respimat  Commonly known as: COMBIVENT  Inhale 1 puff into the lungs every 6 (six) hours as needed for wheezing.    lisinopril 10 MG tablet  Commonly known as: ZESTRIL  Take 10 mg by mouth daily.    multivitamin tablet  Take 1 tablet by mouth daily.    nystatin 100000 UNIT/ML suspension  Commonly known as: MYCOSTATIN  Take 5 mLs (500,000 Units total) by mouth 4 (four) times daily for 3 days.    omeprazole 20 MG capsule  Commonly known as: PRILOSEC  Take 2 capsules (40 mg total) by mouth daily.  What changed: how much to take    predniSONE 20 MG tablet  Commonly known as: Deltasone  Take 3 tablets by mouth daily x2 days; then 2 tablet by mouth daily x3 days; then 1 tablet by mouth daily x3 days; then half tablet by mouth daily x3 days and stop prednisone.    vitamin B-12 500 MCG tablet  Commonly known as: CYANOCOBALAMIN  Take 500 mcg by mouth daily.    zinc sulfate 220 (50 Zn) MG capsule  Take 1 capsule (220 mg total) by mouth daily.  Start taking on: September 06, 2020              Durable Medical Equipment   (From admission, onward)           Start      Ordered    09/05/20 0759  For home use only DME oxygen  Once        Question Answer Comment   Length of Need 12 Months    Mode or (Route) Nasal cannula    Liters per Minute 4    Frequency Continuous (stationary and portable oxygen unit needed)    Oxygen conserving device Yes    Oxygen delivery system Gas       09/05/20 0758           No Known Allergies   Follow-up Information     [facility] Follow up.    Why:  Home Oxygen         Richardean Chimera, MD. Schedule an appointment as soon as possible for a visit in 2 week(s).    Specialty: Family Medicine  Contact information:  [address] [place] [address]  [phone]                The results of significant diagnostics from this hospitalization (including imaging, microbiology, ancillary and laboratory) are listed below for reference.      Significant Diagnostic Studies:  DG Chest Port 1 View    Result Date: 09/01/2020  CLINICAL DATA:  Initial evaluation for acute approximately, probable COVID. EXAM: PORTABLE CHEST 1 VIEW COMPARISON:  Prior radiograph from 12/03/2018. FINDINGS: Mild cardiomegaly.  Mediastinal silhouette within normal limits. Lungs mildly hypoinflated. Multifocal parenchymal predominantly peripheral opacity seen involving the mid and lower lungs, concerning for multifocal pneumonia, likely COVID pneumonia. No pulmonary edema or pleural effusion. No pneumothorax. No acute osseous finding. IMPRESSION: Multifocal parenchymal opacities involving the mid and lower lungs, concerning for multifocal pneumonia. COVID pneumonia is suspected. Electronically Signed   By: Rise Mu M.D.   On: 09/01/2020 01:59     Microbiology:  Recent Results (from the past 240 hour(s))   Resp Panel by RT-PCR (Flu A&B, Covid) Nasopharyngeal Swab     Status: Abnormal    Collection Time: 09/01/20  2:25 AM    Specimen: Nasopharyngeal Swab; Nasopharyngeal(NP) swabs in vial transport medium   Result Value Ref Range Status    SARS Coronavirus 2 by RT PCR POSITIVE (A)  NEGATIVE Final     Comment: RESULT CALLED TO, READ BACK BY AND VERIFIED WITH:  NICKOLS,K @ 0340 ON 09/01/20 BY JUW  (NOTE)  SARS-CoV-2 target nucleic acids are DETECTED.    The SARS-CoV-2 RNA is generally detectable in upper respiratory  specimens during the acute phase of infection. Positive results are  indicative of the presence of the identified virus, but do not rule  out bacterial infection or co-infection with other pathogens not  detected by the test. Clinical correlation with patient history and  other diagnostic information is necessary to determine patient  infection status. The expected result is Negative.    Fact Sheet for Patients:  BloggerCourse.com    Fact Sheet for Healthcare Providers:  SeriousBroker.it    This test is not yet approved or cleared by the [place] FDA and   has been authorized for detection and/or diagnosis of SARS-CoV-2 by  FDA under an Emergency Use Authorization (EUA).  This EUA will  remain in effect (meaning this test can be  used) for the duration of   the COVID-19 declaration under Section 564(b)(1) of the Act, 21  U.S.C. section 360bbb-3(b)(1), unless the authorization is  terminated or revoked sooner.       Influenza A by PCR NEGATIVE NEGATIVE Final    Influenza B by PCR NEGATIVE NEGATIVE Final     Comment: (NOTE)  The Xpert Xpress SARS-CoV-2/FLU/RSV plus assay is intended as an aid  in the diagnosis of influenza from Nasopharyngeal swab specimens and  should not be used as a sole basis for treatment. Nasal washings and  aspirates are unacceptable for Xpert Xpress SARS-CoV-2/FLU/RSV  testing.    Fact Sheet for Patients:  BloggerCourse.com    Fact Sheet for Healthcare Providers:  SeriousBroker.it    This test is not yet approved or cleared by the [place] FDA and  has been authorized for detection and/or diagnosis of SARS-CoV-2 by  FDA under an Emergency Use Authorization  (EUA). This EUA will remain  in effect (meaning this test can be used) for the duration of the  COVID-19 declaration under Section 564(b)(1) of the Act, 21 U.S.C.  section 360bbb-3(b)(1), unless the authorization is terminated or  revoked.    Performed at [facility], [address] [place], [place] [address]    Blood Culture (routine x 2)     Status: None (Preliminary result)    Collection Time: 09/01/20  2:29 AM    Specimen: Right Antecubital; Blood   Result Value Ref Range Status    Specimen Description RIGHT ANTECUBITAL  Final    Special Requests   Final     BOTTLES DRAWN AEROBIC AND ANAEROBIC Blood Culture adequate volume    Culture   Final     NO GROWTH 4 DAYS  Performed at [facility], [address], [place], [place] [address]     Report Status PENDING  Incomplete   Blood Culture (routine x 2)     Status: None (Preliminary result)    Collection Time: 09/01/20  2:31 AM    Specimen: BLOOD RIGHT HAND   Result Value Ref Range Status    Specimen Description BLOOD RIGHT HAND  Final    Special Requests   Final     BOTTLES DRAWN AEROBIC AND ANAEROBIC Blood Culture adequate volume  Culture   Final     NO GROWTH 4 DAYS  Performed at [facility], [address], [place], [place] [address]     Report Status PENDING  Incomplete       Labs:  Basic Metabolic Panel:  Recent Labs   Lab 09/01/20  0230 09/05/20  0624   NA 126* 134*   K 3.8 4.4   CL 95* 98   CO2 21* 28   GLUCOSE 110* 113*   BUN 16 22*   CREATININE 1.01 0.92   CALCIUM 7.9* 8.2*   MG  --  2.1     Liver Function Tests:  Recent Labs   Lab 09/01/20  0230 09/05/20  0624   AST 39 36   ALT 20 46*   ALKPHOS 38 52   BILITOT 0.6 0.6   PROT 6.5 6.3*   ALBUMIN 3.1* 2.8*     CBC:  Recent Labs   Lab 09/01/20  0230 09/05/20  0624   WBC 7.9 11.1*   NEUTROABS 6.7  --    HGB 13.2 14.2   HCT 39.7 43.9   MCV 85.9 88.7   PLT 177 308     Signed:    Vassie Loll MD.   [facility]  09/05/2020, 1:21 PM      Electronically signed by Vassie Loll, MD at 09/05/2020  1:30 PM  EST

## 2020-09-05 NOTE — TOC Transition Note (Signed)
Transition of Care [facility]) - CM/SW Discharge Note   Patient Details  Name: Aaron Soto MRN: [id number] Date of Birth: 04-Jul-1966  [facility]) CM/SW Contact:  Leitha Bleak, RN Phone Number: 09/05/2020, 10:49 AM   Clinical Narrative:   Patient will discharge home today with home oxygen. Choices given, [facility] with [employer] accepted the referral and will deliver to 300. Orders and qualifying note completed.    Final next level of care: Home/Self Care Barriers to Discharge: Barriers Resolved   Patient Goals and CMS Choice Patient states their goals for this hospitalization and ongoing recovery are:: to go home. CMS Medicare.gov Compare Post Acute Care list provided to:: Patient    Discharge Placement      Patient and family notified of of transfer: 09/05/20  Discharge Plan and Services             DME Arranged: Oxygen DME Agency: [facility] Date DME Agency Contacted: 09/05/20 Time DME Agency Contacted: 1049 Representative spoke with at DME Agency: [facility]     Readmission Risk Interventions Readmission Risk Prevention Plan 09/03/2020  Medication Screening Complete  Transportation Screening Complete  Some recent data might be hidden

## 2020-09-05 NOTE — Discharge Summary (Signed)
Physician Discharge Summary  Aaron LefevreKeith L Soto YNW:[id number] DOB: 01/23/1966 DOA: 09/01/2020  PCP: [name], [name], MD  Admit date: 09/01/2020 Discharge date: 09/05/2020  Time spent: 35 minutes  Recommendations for Outpatient Follow-up:  1. Repeat basic metabolic panel to follow electrolytes and renal function 2. Reassess blood pressure and adjust antihypertensive regimen as needed 3. Repeat chest x-ray in 6 weeks to assure complete resolution of infiltrates 4. Continue assisting patient with weight management. 5. Reassess the need for oxygen supplementation.   Discharge Diagnoses:  Principal Problem:   Pneumonia due to COVID-19 virus Active Problems:   Hypertension   Anxiety with depression   Hyponatremia   Protein calorie malnutrition (HCC)   Acute respiratory failure with hypoxia (HCC)   Lobar pneumonia (HCC)   Discharge Condition: Stable and improved.  Discharged home with home health oxygen and instruction to follow-up with PCP in 2 weeks.  CODE STATUS: Full code  Diet recommendation: Low calorie/heart healthy diet.  Filed Weights   09/01/20 0051  Weight: 119 kg    History of present illness:  As per H&P written by Dr. Robb Matarrtiz on 09/01/2020 Aaron HolesKeith L Evansis a 55 y.o.malewith medical history significant ofanxiety, depression, hypertension, class II obesity, history of back surgery who is coming to the emergency department with a history of 9 days of progressively worse viral symptoms which include sore throat, cough of occasionally productive greenish sputum, fatigue, malaise, pleuritic chest pain, abdominal pain, multiple episodes of diarrhea, emesis, decreased appetite and some difficulty sleeping. He saw his PCP several days ago and was prescribed Paxlovid, but after seeing the medication information sheet he took it back to the pharmacy and did not take it because of potential side effects. He denies lower extremity edema, dysuria, frequency or hematuria. No polyuria,  polydipsia, polyphagia or blurred vision.  ED Course:Initial vital signs were temperature100.5 F, pulse 104, respirations 36, BP 145/83 mmHg O2 sat was 87 to 92% on room air. The patient was placed on nasal cannula O2 at 2 LPM and his oxygen saturation increased to 94 to 96%.The patient received dexamethasone, ceftriaxone and azithromycin. He declined remdesivir and interferon treatment.  Hospital Course:  1-acute respiratory failure with hypoxia secondary to COVID-19 pneumonia: -Continue as needed bronchodilators and antitussive management. -Patient reports feeling much better and ready to go home; still mildly short winded with activity and experiencing intermittent coughing spells. -On desaturation screening found to require 4 L nasal cannula Supplementation; home health oxygen has been arranged at discharge. -Patient has been discharged with instructions for steroids tapering -Given elevated procalcitonin level and lobe infiltrates patient was treated with antibiotics for bacterial pneumonia component; 4 more days of oral antibiotics to complete treatment prescribed. -Encourage to continue proning position and to use incentive respirometer/flutter valve.  2-gastroesophageal reflux disease/GI prophylaxis -Continue treatment with PPI.  3-class II obesity -Low calorie diet and portion control has been discussed with patient. -Body mass index is 36.58 kg/m.  4-Anxiety with depression -Mood overall stable -Continue BuSpar and Cymbalta -No hallucinations, no suicidal ideation.  5-Hyponatremia/mild dehydration -In the setting of decreased oral intake and acute pneumonia. -Improved/resolved with fluid resuscitation -Patient advised to maintain adequate oral hydration  6-thrush -Will continue treatment with nystatin. -Patient advised to maintain adequate oral hygiene.  7-insomnia -As needed trazodone was used while hospitalized.  8-essential hypertension -Overall  stable and well-controlled -Resume home antihypertensive agents -Heart healthy diet encouraged.  Procedures: See below for x-ray reports  Consultations:  None  Discharge Exam: Vitals:   09/04/20 2045  09/05/20 0513  BP: 121/76 123/85  Pulse: 87 96  Resp: 20 20  Temp: (!) 97.4 F (36.3 C) 98 F (36.7 C)  SpO2: 94% 97%    General: No chest pain, no nausea, no vomiting, no fever.  Still feeling short winded with activity, but improved.  Patient found to require 4 L nasal cannula supplementation at discharge to maintain stable oxygen saturation. Cardiovascular: S1 and S2, no rubs, no gallops, no JVD, no murmur. Respiratory: Improved air movement bilaterally; no wheezing, no using accessory muscles.  Positive scattered rhonchi appreciated on examination. Abdomen: Obese, soft, nontender, distended, positive bowel sounds Extremities: No cyanosis, no clubbing.  Discharge Instructions   Discharge Instructions    Discharge instructions   Complete by: As directed    Take medications as prescribed Maintain adequate hydration  Arrange follow-up with PCP in 2 weeks Use oxygen as instructed Follow low calorie diet. Increase physical activity gradually Continue to follow the 3 W's: Wait 6 feet apart (social distance), Wash hands frequently and Wear a mask Continue to follow 10 more days of isolation.     Allergies as of 09/05/2020   No Known Allergies     Medication List    STOP taking these medications   ibuprofen 200 MG tablet Commonly known as: ADVIL   ipratropium 0.06 % nasal spray Commonly known as: ATROVENT   ketoprofen 75 MG capsule Commonly known as: ORUDIS   nirmatrelvir/ritonavir EUA Tabs Commonly known as: PAXLOVID   OVER THE COUNTER MEDICATION     TAKE these medications   amoxicillin-clavulanate 875-125 MG tablet Commonly known as: AUGMENTIN Take 1 tablet by mouth every 12 (twelve) hours for 4 days.   ascorbic acid 500 MG tablet Commonly known as:  VITAMIN C Take 1 tablet (500 mg total) by mouth daily. Start taking on: September 06, 2020   benzonatate 100 MG capsule Commonly known as: TESSALON Take 100 mg by mouth 3 (three) times daily.   busPIRone 10 MG tablet Commonly known as: BUSPAR Take 20 mg by mouth 3 (three) times daily.   celecoxib 200 MG capsule Commonly known as: CELEBREX Take 200 mg by mouth daily.   cyclobenzaprine 10 MG tablet Commonly known as: FLEXERIL Take 10 mg by mouth daily.   DULoxetine 60 MG capsule Commonly known as: CYMBALTA Take 60 mg by mouth 2 (two) times daily.   guaiFENesin-dextromethorphan 100-10 MG/5ML syrup Commonly known as: ROBITUSSIN DM Take 5 mLs by mouth every 4 (four) hours as needed for cough.   imipramine 10 MG tablet Commonly known as: TOFRANIL Take 30 mg by mouth at bedtime.   Ipratropium-Albuterol 20-100 MCG/ACT Aers respimat Commonly known as: COMBIVENT Inhale 1 puff into the lungs every 6 (six) hours as needed for wheezing.   lisinopril 10 MG tablet Commonly known as: ZESTRIL Take 10 mg by mouth daily.   multivitamin tablet Take 1 tablet by mouth daily.   nystatin 100000 UNIT/ML suspension Commonly known as: MYCOSTATIN Take 5 mLs (500,000 Units total) by mouth 4 (four) times daily for 3 days.   omeprazole 20 MG capsule Commonly known as: PRILOSEC Take 2 capsules (40 mg total) by mouth daily. What changed: how much to take   predniSONE 20 MG tablet Commonly known as: Deltasone Take 3 tablets by mouth daily x2 days; then 2 tablet by mouth daily x3 days; then 1 tablet by mouth daily x3 days; then half tablet by mouth daily x3 days and stop prednisone.   vitamin B-12 500 MCG tablet Commonly known  as: CYANOCOBALAMIN Take 500 mcg by mouth daily.   zinc sulfate 220 (50 Zn) MG capsule Take 1 capsule (220 mg total) by mouth daily. Start taking on: September 06, 2020            Durable Medical Equipment  (From admission, onward)         Start     Ordered    09/05/20 0759  For home use only DME oxygen  Once       Question Answer Comment  Length of Need 12 Months   Mode or (Route) Nasal cannula   Liters per Minute 4   Frequency Continuous (stationary and portable oxygen unit needed)   Oxygen conserving device Yes   Oxygen delivery system Gas      09/05/20 0758         No Known Allergies  Follow-up Information    [facility] Follow up.   Why:  Home Oxygen       Richardean Chimera, MD. Schedule an appointment as soon as possible for a visit in 2 week(s).   Specialty: Family Medicine Contact information: [address] [place] [address] [phone]               The results of significant diagnostics from this hospitalization (including imaging, microbiology, ancillary and laboratory) are listed below for reference.    Significant Diagnostic Studies: DG Chest Port 1 View  Result Date: 09/01/2020 CLINICAL DATA:  Initial evaluation for acute approximately, probable COVID. EXAM: PORTABLE CHEST 1 VIEW COMPARISON:  Prior radiograph from 12/03/2018. FINDINGS: Mild cardiomegaly.  Mediastinal silhouette within normal limits. Lungs mildly hypoinflated. Multifocal parenchymal predominantly peripheral opacity seen involving the mid and lower lungs, concerning for multifocal pneumonia, likely COVID pneumonia. No pulmonary edema or pleural effusion. No pneumothorax. No acute osseous finding. IMPRESSION: Multifocal parenchymal opacities involving the mid and lower lungs, concerning for multifocal pneumonia. COVID pneumonia is suspected. Electronically Signed   By: Rise Mu M.D.   On: 09/01/2020 01:59    Microbiology: Recent Results (from the past 240 hour(s))  Resp Panel by RT-PCR (Flu A&B, Covid) Nasopharyngeal Swab     Status: Abnormal   Collection Time: 09/01/20  2:25 AM   Specimen: Nasopharyngeal Swab; Nasopharyngeal(NP) swabs in vial transport medium  Result Value Ref Range Status   SARS Coronavirus 2 by RT PCR POSITIVE  (A) NEGATIVE Final    Comment: RESULT CALLED TO, READ BACK BY AND VERIFIED WITH: NICKOLS,K @ 0340 ON 09/01/20 BY JUW (NOTE) SARS-CoV-2 target nucleic acids are DETECTED.  The SARS-CoV-2 RNA is generally detectable in upper respiratory specimens during the acute phase of infection. Positive results are indicative of the presence of the identified virus, but do not rule out bacterial infection or co-infection with other pathogens not detected by the test. Clinical correlation with patient history and other diagnostic information is necessary to determine patient infection status. The expected result is Negative.  Fact Sheet for Patients: BloggerCourse.com  Fact Sheet for Healthcare Providers: SeriousBroker.it  This test is not yet approved or cleared by the [place] FDA and  has been authorized for detection and/or diagnosis of SARS-CoV-2 by FDA under an Emergency Use Authorization (EUA).  This EUA will remain in effect (meaning this test can be  used) for the duration of  the COVID-19 declaration under Section 564(b)(1) of the Act, 21 U.S.C. section 360bbb-3(b)(1), unless the authorization is terminated or revoked sooner.     Influenza A by PCR NEGATIVE NEGATIVE Final  Influenza B by PCR NEGATIVE NEGATIVE Final    Comment: (NOTE) The Xpert Xpress SARS-CoV-2/FLU/RSV plus assay is intended as an aid in the diagnosis of influenza from Nasopharyngeal swab specimens and should not be used as a sole basis for treatment. Nasal washings and aspirates are unacceptable for Xpert Xpress SARS-CoV-2/FLU/RSV testing.  Fact Sheet for Patients: BloggerCourse.com  Fact Sheet for Healthcare Providers: SeriousBroker.it  This test is not yet approved or cleared by the [place] FDA and has been authorized for detection and/or diagnosis of SARS-CoV-2 by FDA under an Emergency Use  Authorization (EUA). This EUA will remain in effect (meaning this test can be used) for the duration of the COVID-19 declaration under Section 564(b)(1) of the Act, 21 U.S.C. section 360bbb-3(b)(1), unless the authorization is terminated or revoked.  Performed at [facility], [address], [place], [place] [address]   Blood Culture (routine x 2)     Status: None (Preliminary result)   Collection Time: 09/01/20  2:29 AM   Specimen: Right Antecubital; Blood  Result Value Ref Range Status   Specimen Description RIGHT ANTECUBITAL  Final   Special Requests   Final    BOTTLES DRAWN AEROBIC AND ANAEROBIC Blood Culture adequate volume   Culture   Final    NO GROWTH 4 DAYS Performed at [facility], [address], [place], [place] [address]    Report Status PENDING  Incomplete  Blood Culture (routine x 2)     Status: None (Preliminary result)   Collection Time: 09/01/20  2:31 AM   Specimen: BLOOD RIGHT HAND  Result Value Ref Range Status   Specimen Description BLOOD RIGHT HAND  Final   Special Requests   Final    BOTTLES DRAWN AEROBIC AND ANAEROBIC Blood Culture adequate volume   Culture   Final    NO GROWTH 4 DAYS Performed at [facility], [address], [place], [place] [address]    Report Status PENDING  Incomplete     Labs: Basic Metabolic Panel: Recent Labs  Lab 09/01/20 0230 09/05/20 0624  NA 126* 134*  K 3.8 4.4  CL 95* 98  CO2 21* 28  GLUCOSE 110* 113*  BUN 16 22*  CREATININE 1.01 0.92  CALCIUM 7.9* 8.2*  MG  --  2.1   Liver Function Tests: Recent Labs  Lab 09/01/20 0230 09/05/20 0624  AST 39 36  ALT 20 46*  ALKPHOS 38 52  BILITOT 0.6 0.6  PROT 6.5 6.3*  ALBUMIN 3.1* 2.8*   CBC: Recent Labs  Lab 09/01/20 0230 09/05/20 0624  WBC 7.9 11.1*  NEUTROABS 6.7  --   HGB 13.2 14.2  HCT 39.7 43.9  MCV 85.9 88.7  PLT 177 308    Signed:  Vassie Loll MD.  [facility] 09/05/2020, 1:21 PM

## 2020-09-06 LAB — CULTURE, BLOOD (ROUTINE X 2)
Culture: NO GROWTH
Culture: NO GROWTH
Special Requests: ADEQUATE
Special Requests: ADEQUATE

## 2022-05-25 NOTE — Progress Notes (Signed)
Formatting of this note is different from the original.  Images from the original note were not included.      POST OPERATIVE VISIT:    Jeremy Park, 56 y.o.  Chief Complaint   Patient presents with   ? Back Pain     Lower    ? 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 Jeremy Park here for follow-up status post removal of spinal cord stimulator and leads completed 05/20/2022.    Surgical Procedure: Removal IPG and leads on 05/20/2022.     Allergies   Allergen Reactions   ? Hydromorphone Rash     Nervousness  Nervousness    ? Tramadol Other     Current Home Medications    Medication Sig   busPIRone (BUSPAR) 10 mg tablet Take two tablets (20 mg dose) by mouth 3 (three) times a day.   DULoxetine HCl (CYMBALTA) 60 mg capsule Take one capsule (60 mg dose) by mouth.   ibuprofen (ADVIL,MOTRIN) 200 mg tablet Take one tablet (200 mg dose) by mouth.   lidocaine (XYLOCAINE) 5 % ointment Apply topically see administration instructions.   lisinopril (PRINIVIL,ZESTRIL) 10 mg tablet Take one tablet (10 mg dose) by mouth daily.   magnesium gluconate 550 mg tablet Take 30 mg by mouth.   Multiple Vitamin (MULTI-VITAMIN) tablet Take one tablet by mouth daily.       Assessment:  No diagnosis found.   Site Assessment:  Vertical incision on cervical spine area clean, dry, no erythema, intact.  Incision lines on right lumbar/buttock area are intact, clean and dry.  Noted very small amount of bloody midpoint of the buttock incision.  PLAN:  1. Site evaluation completed as described above.  2. Patient is advised to not scrub or rub the incision sites.  May shower.  Reminded not to bend twist or reach overhead.  3. She is also instructed if there are any signs and symptoms of infection such as fever, drainage, increasing tenderness the office should be notified.  4. Patient will make an appointment for 1 week follow-up and if necessary the patient may cancel this.  5. He notes he  continues to use CBD tincture and this is effective in helping his sleep as well as his pain.    The patient was seen & evaluated by Judie Bonus, NP-C. The case was reviewed and care coordinated with  Dr. Richardson Dopp [facility], ANP  05/25/2022, 5:15 PM      Electronically signed by Judie Bonus, ANP at 05/25/2022  5:19 PM EDT

## 2022-12-23 ENCOUNTER — Inpatient Hospital Stay
Admit: 2022-12-23 | Discharge: 2022-12-23 | Disposition: A | Discharge status: Home / Self Care | Payer: PRIVATE HEALTH INSURANCE | Attending: Emergency Medicine

## 2022-12-23 ENCOUNTER — Emergency Department: Admit: 2022-12-23 | Payer: PRIVATE HEALTH INSURANCE

## 2022-12-23 ENCOUNTER — Emergency Department: Payer: PRIVATE HEALTH INSURANCE

## 2022-12-23 DIAGNOSIS — N132 Hydronephrosis with renal and ureteral calculous obstruction: Secondary | ICD-10-CM

## 2022-12-23 DIAGNOSIS — N200 Calculus of kidney: Secondary | ICD-10-CM

## 2022-12-23 LAB — URINALYSIS WITH MICROSCOPIC
Bilirubin, Urine: NEGATIVE
Glucose, Ur: NEGATIVE mg/dL
Ketones, Urine: NEGATIVE mg/dL
Leukocyte Esterase, Urine: NEGATIVE
Nitrite, Urine: NEGATIVE
Protein, UA: 100 mg/dL — AB
RBC, UA: >100 /hpf — ABNORMAL HIGH (ref 0–3)
Specific Gravity, UA: >1.03 — ABNORMAL HIGH (ref 1.003–1.030)
Urobilinogen, Urine: 0.2 EU/dL (ref 0.2–1.0)
pH, Urine: 6 (ref 5.0–8.0)

## 2022-12-23 MED ORDER — ONDANSETRON HCL 4 MG/2ML IJ SOLN
4 | INTRAMUSCULAR | Status: AC
Start: 2022-12-23 — End: 2022-12-23
  Administered 2022-12-23: 07:00:00 4 mg via INTRAVENOUS

## 2022-12-23 MED ORDER — TAMSULOSIN HCL 0.4 MG PO CAPS
0.4 | ORAL | Status: AC
Start: 2022-12-23 — End: 2022-12-23
  Administered 2022-12-23: 07:00:00 0.4 mg via ORAL

## 2022-12-23 MED ORDER — TAMSULOSIN HCL 0.4 MG PO CAPS
0.4 | ORAL_CAPSULE | Freq: Every day | ORAL | 0 refills | Status: AC
Start: 2022-12-23 — End: 2023-01-07

## 2022-12-23 MED ORDER — KETOROLAC TROMETHAMINE 30 MG/ML IJ SOLN
30 | INTRAMUSCULAR | Status: AC
Start: 2022-12-23 — End: 2022-12-23
  Administered 2022-12-23: 07:00:00 30 mg via INTRAVENOUS

## 2022-12-23 MED ORDER — KETOROLAC TROMETHAMINE 10 MG PO TABS
10 | ORAL_TABLET | Freq: Four times a day (QID) | ORAL | 0 refills | Status: AC
Start: 2022-12-23 — End: 2022-12-28

## 2022-12-23 MED ORDER — OXYCODONE-ACETAMINOPHEN 5-325 MG PO TABS
5-325 | ORAL_TABLET | Freq: Four times a day (QID) | ORAL | 0 refills | Status: AC | PRN
Start: 2022-12-23 — End: 2022-12-26

## 2022-12-23 MED ORDER — FENTANYL CITRATE (PF) 50 MCG/ML IJ SOLN
50 | INTRAMUSCULAR | Status: DC
Start: 2022-12-23 — End: 2022-12-23

## 2022-12-23 MED FILL — TAMSULOSIN HCL 0.4 MG PO CAPS: 0.4 MG | ORAL | Qty: 1

## 2022-12-23 MED FILL — KETOROLAC TROMETHAMINE 30 MG/ML IJ SOLN: 30 MG/ML | INTRAMUSCULAR | Qty: 1

## 2022-12-23 MED FILL — ONDANSETRON HCL 4 MG/2ML IJ SOLN: 4 MG/2ML | INTRAMUSCULAR | Qty: 2

## 2022-12-23 NOTE — ED Notes (Signed)
Discharged home to self.  Ambulatory out of ED.  VS WDL.  0 s/s acute distress.  Respirations even and unlabored.  Discharge instructions and follow up care reviewed.  Patient receptive and demonstrated knowledge of instruction via teach-back method.

## 2022-12-23 NOTE — ED Triage Notes (Signed)
Patient presents c/o right flank pain. Patient states symptoms began around 2030 this evening and s/s also include urine trickling and burning with urination. Patient reports history of kidney stones.     Patient reports taking tylenol around 0000 with no relief.

## 2022-12-23 NOTE — ED Notes (Signed)
[facility] EMERGENCY DEPT  EMERGENCY DEPARTMENT ENCOUNTER      Pt Name: Jeremy Park  MRN: [id number]  Birthdate 1965/11/30  Date of evaluation: 12/23/2022  Provider: Belia Heman. Tomisha Reppucci, MD  3:08 AM    CHIEF COMPLAINT       Chief Complaint   Patient presents with    Flank Pain         HISTORY OF PRESENT ILLNESS    Corley Maffeo is a 57 y.o. male who presents to the emergency department with complaint of right lower quadrant abdominal pain and right flank pain that started 2 days ago.  He reports nausea and vomiting.  Pain is rated 10/10 in severity.  He had similar pain due to kidney stone.  He suspects a kidney stone.        Nursing Notes were reviewed.    REVIEW OF SYSTEMS       Review of Systems   Constitutional: Negative.    Respiratory: Negative.     Cardiovascular: Negative.    Gastrointestinal:  Positive for nausea and vomiting.   Genitourinary:  Positive for difficulty urinating and flank pain. Negative for hematuria.   Neurological: Negative.    Hematological: Negative.        Except as noted above the remainder of the review of systems was reviewed and negative.       PAST MEDICAL HISTORY   History reviewed. No pertinent past medical history.      SURGICAL HISTORY     History reviewed. No pertinent surgical history.      CURRENT MEDICATIONS       Previous Medications    No medications on file       ALLERGIES     Hydromorphone and Tramadol    FAMILY HISTORY     History reviewed. No pertinent family history.       SOCIAL HISTORY       Social History     Socioeconomic History    Marital status: Married     Spouse name: None    Number of children: None    Years of education: None    Highest education level: None       SCREENINGS         Glasgow Coma Scale  Eye Opening: Spontaneous  Best Verbal Response: Oriented  Best Motor Response: Obeys commands  Glasgow Coma Scale Score: 15                     CIWA Assessment  BP: (!) 148/97  Pulse: 69                 PHYSICAL EXAM       ED Triage Vitals [12/23/22 0215]   BP Temp Temp Source  Pulse Respirations SpO2 Height Weight - Scale   (!) 148/97 97.9 F (36.6 C) Oral 69 18 98 % 1.803 m (5\' 11" ) 117.9 kg (260 lb)       Physical Exam  Vitals and nursing note reviewed.   Constitutional:       Appearance: He is obese.   HENT:      Head: Normocephalic.      Mouth/Throat:      Mouth: Mucous membranes are moist.      Pharynx: Oropharynx is clear.   Eyes:      Extraocular Movements: Extraocular movements intact.      Conjunctiva/sclera: Conjunctivae normal.      Pupils: Pupils are equal, round, and reactive to light.  Cardiovascular:      Rate and Rhythm: Normal rate and regular rhythm.      Pulses: Normal pulses.      Heart sounds: Normal heart sounds.   Pulmonary:      Effort: Pulmonary effort is normal.      Breath sounds: Normal breath sounds.   Skin:     General: Skin is warm.      Capillary Refill: Capillary refill takes less than 2 seconds.   Neurological:      General: No focal deficit present.      Mental Status: He is alert and oriented to person, place, and time.         DIAGNOSTIC RESULTS     EKG: All EKG's are interpreted by the Emergency Department Physician who either signs or Co-signs this chart in the absence of a cardiologist.        RADIOLOGY:   Non-plain film images such as CT, Ultrasound and MRI are read by the radiologist. Plain radiographic images are visualized and preliminarily interpreted by the emergency physician with the below findings:        Interpretation per the Radiologist below, if available at the time of this note:    CT ABDOMEN PELVIS W WO CONTRAST Additional Contrast? None    (Results Pending)         ED BEDSIDE ULTRASOUND:   Performed by ED Physician - none    LABS:  Labs Reviewed   URINALYSIS WITH MICROSCOPIC - Abnormal; Notable for the following components:       Result Value    Specific Gravity, UA >1.030 (*)     Protein, UA 100 (*)     Blood, Urine Large (*)     RBC, UA >100 (*)     BACTERIA, URINE 1+ (*)     All other components within normal limits       All  other labs were within normal range or not returned as of this dictation.    EMERGENCY DEPARTMENT COURSE and DIFFERENTIAL DIAGNOSIS/MDM:   Vitals:    Vitals:    12/23/22 0215   BP: (!) 148/97   Pulse: 69   Resp: 18   Temp: 97.9 F (36.6 C)   TempSrc: Oral   SpO2: 98%   Weight: 117.9 kg (260 lb)   Height: 1.803 m (5\' 11" )           Medical Decision Making  57 year old male presents with right lower quadrant abdominal pain and right flank pain.  Consider kidney stone, aortic dissection, appendicitis.  I suspect kidney stone.  Patient was given Flomax 0.4 mg, Toradol 30 mg IV and fentanyl.  He declined fentanyl.  CT scan showed 3 mm stone at the distal UVJ.  On reassessment pain is much improved.  He has good prognosis of passing stone.  Treatment plan discussed with patient.  He is in agreement.  He is advised to follow-up with urology if no improvement in 5 days.    Amount and/or Complexity of Data Reviewed  Labs: ordered.  Radiology: ordered.    Risk  Prescription drug management.            REASSESSMENT          CRITICAL CARE TIME   Total Critical Care time was  minutes, excluding separately reportable procedures.  There was a high probability of clinically significant/life threatening deterioration in the patient's condition which required my urgent intervention.      CONSULTS:  None  PROCEDURES:  Unless otherwise noted below, none     Procedures        FINAL IMPRESSION    No diagnosis found.      DISPOSITION/PLAN   DISPOSITION        PATIENT REFERRED TO:  No follow-up provider specified.    DISCHARGE MEDICATIONS:  New Prescriptions    No medications on file     Controlled Substances Monitoring:          No data to display                (Please note that portions of this note were completed with a voice recognition program.  Efforts were made to edit the dictations but occasionally words are mis-transcribed.)    Belia Heman. Taaliyah Delpriore, MD (electronically signed)  Attending Emergency Physician           Anaisa Radi, Belia Heman,  MD  12/23/22 [phone]

## 2022-12-23 NOTE — ED Notes (Signed)
Patient to CT.

## 2022-12-30 ENCOUNTER — Encounter (INDEPENDENT_AMBULATORY_CARE_PROVIDER_SITE_OTHER): Payer: Self-pay | Admitting: *Deleted

## 2023-04-07 ENCOUNTER — Encounter (INDEPENDENT_AMBULATORY_CARE_PROVIDER_SITE_OTHER): Payer: Self-pay | Admitting: *Deleted

## 2023-04-19 ENCOUNTER — Encounter (INDEPENDENT_AMBULATORY_CARE_PROVIDER_SITE_OTHER): Payer: Self-pay | Admitting: *Deleted

## 2023-06-11 ENCOUNTER — Encounter: Payer: Self-pay | Admitting: Orthopedic Surgery

## 2023-06-11 ENCOUNTER — Other Ambulatory Visit (INDEPENDENT_AMBULATORY_CARE_PROVIDER_SITE_OTHER): Payer: Managed Care, Other (non HMO)

## 2023-06-11 ENCOUNTER — Ambulatory Visit (INDEPENDENT_AMBULATORY_CARE_PROVIDER_SITE_OTHER): Payer: Managed Care, Other (non HMO) | Admitting: Orthopedic Surgery

## 2023-06-11 DIAGNOSIS — M542 Cervicalgia: Secondary | ICD-10-CM

## 2023-06-11 NOTE — Progress Notes (Signed)
Office Visit Note   Patient: Aaron Soto           Date of Birth: Jan 01, 1966           MRN: [id number] Visit Date: 06/11/2023 Requested by: Richardean Chimera, MD [address] [place],  [place] [address] PCP: Richardean Chimera, MD  Subjective: Chief Complaint  Patient presents with   Left Shoulder - Pain    HPI: Aaron Soto is a 57 y.o. male who presents to the office reporting left shoulder and arm pain and he is here for second opinion.  Nonarthrogram MRI scan is available to review on [facility].  Patient states he is ready to have something done.  He reports symptoms starting 1 and half years ago at work when he was trying to pick something up and felt a pop in the shoulder.  Tried 1 to 2 injections which did not help.  Also had a fall last fall which also hurt his arm and shoulder.  Pain runs down the arm in the C6 distribution.  Denies much in the way of mechanical symptoms in the shoulder.  Does report occasional neck pain but no numbness and tingling.  Symptoms do improve when he puts his arm up over his head particularly when he is trying to sleep.  He cannot sleep on the left-hand side.  His pain is not really dependent on neck position.  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                ROS: All systems reviewed are negative as they relate to the chief complaint within the history of present illness.  Patient denies fevers or chills.  Assessment & Plan: Visit Diagnoses:  1. Neck pain     Plan: Impression is slight external rotation weakness which could be related to the spinoglenoid notch cyst although it is not quite as big as I would expect to get of symptoms.  He is also having in addition to weakness a fair amount of pain in the scapula and the arm.  I think the neck could also be at play here in terms of radiculopathy extending down the arm particularly in the C6 distribution.  The patients I have seen with  symptomatic spinal glenoid notch cyst typically have weakness but not as much pain.  Pain is a big component of his presentation.  Rotator cuff intact on MRI scan.  Cervical spine radiographs today do show degenerative changes at C5-6 and C6-7.  Due to duration of symptoms and failure of conservative management MRI scan of the cervical spine is indicated particularly if we are considering surgical intervention which in his case would be labral debridement and biceps tenodesis.  Follow-up after that MRI scan.  Follow-Up Instructions: No follow-ups on file.   Orders:  Orders Placed This Encounter  Procedures   XR Cervical Spine 2 or 3 views   MR Cervical Spine w/o contrast   No orders of the defined types were placed in this encounter.     Procedures: No procedures performed   Clinical Data: No additional findings.  Objective: Vital Signs: There were no vitals taken for this visit.  Physical Exam:  Constitutional: Patient appears well-developed HEENT:  Head: Normocephalic Eyes:EOM are normal Neck: Normal range of motion Cardiovascular: Normal rate Pulmonary/chest: Effort normal Neurologic: Patient is alert Skin: Skin is warm Psychiatric: Patient has normal mood and affect  Ortho Exam: Ortho exam demonstrates 5 out of 5 grip EPL FPL interosseous wrist flexion extension biceps triceps and deltoid strength.  In particular there is no asymmetric loss of finger extension or arm extension.  Does have 5- out of 5 external rotation weakness on the left compared to the right and this is painful.  Patient has 2 out of 4 radial pulses bilaterally.  No discrete tenderness on the right AC joint.  Mild AC joint tenderness on the left.  No pain with crossarm adduction left versus right.  O'Brien's testing is equivocal on the left negative on the right.  Do not get any coarse grinding or crepitus with internal/external rotation or passive range of motion of that left shoulder.  No definite  paresthesias C5-T1.  No muscle atrophy left arm versus right.  Specialty Comments:  No specialty comments available.  Imaging: XR Cervical Spine 2 or 3 views  Result Date: 06/11/2023 AP lateral radiographs cervical spine reviewed.  Normal lordosis is present.  Moderate degenerative changes noted at C5-6 and C6-7 with disc space narrowing and some anterior osteophytes present.  Facet arthritis mild at this level.    PMFS History: Patient Active Problem List   Diagnosis Date Noted   Lobar pneumonia (HCC)    Acute respiratory failure with hypoxia (HCC) 09/02/2020   Pneumonia due to COVID-19 virus 09/01/2020   Hypertension    Anxiety with depression    Hyponatremia    Protein calorie malnutrition (HCC)    Subarachnoid hemorrhage (HCC) 12/03/2018   Past Medical History:  Diagnosis Date   Anxiety    Depression    Hypertension     Family History  Problem Relation Age of Onset   Diabetes Mellitus II Father        B/L lower extremities amputation.    Past Surgical History:  Procedure Laterality Date   BACK SURGERY     CHOLECYSTECTOMY     HAND SURGERY     IR ANGIO EXTERNAL CAROTID SEL EXT CAROTID BILAT MOD SED  12/05/2018   IR ANGIO INTRA EXTRACRAN SEL INTERNAL CAROTID BILAT MOD SED  12/05/2018   IR ANGIO VERTEBRAL SEL VERTEBRAL UNI R MOD SED  12/05/2018   SPINAL CORD STIMULATOR IMPLANT     Social History   Occupational History   Not on file  Tobacco Use   Smoking status: Former    Current packs/day: 0.00    Types: Cigarettes    Quit date: 08/24/1986    Years since quitting: 36.8   Smokeless tobacco: Never  Substance and Sexual Activity   Alcohol use: No   Drug use: No   Sexual activity: Not on file

## 2023-06-25 ENCOUNTER — Other Ambulatory Visit: Payer: Managed Care, Other (non HMO)

## 2023-06-28 ENCOUNTER — Encounter (INDEPENDENT_AMBULATORY_CARE_PROVIDER_SITE_OTHER): Payer: Self-pay | Admitting: *Deleted

## 2023-06-29 ENCOUNTER — Telehealth: Payer: Self-pay | Admitting: *Deleted

## 2023-06-29 NOTE — Telephone Encounter (Signed)
Pt called stating his insurance denied his MRI because he has not had any PT or home exercise. Pt is doing some home exercises when he is in his hotel room due to traveling. Pt states he is getting worse. I will try to do a reconsideration to get MRI approved

## 2023-06-30 ENCOUNTER — Ambulatory Visit: Payer: Managed Care, Other (non HMO) | Admitting: Orthopedic Surgery

## 2023-07-01 NOTE — Telephone Encounter (Signed)
Pt is aware of approval.

## 2023-07-09 ENCOUNTER — Ambulatory Visit: Payer: Managed Care, Other (non HMO) | Admitting: Orthopedic Surgery

## 2023-07-23 ENCOUNTER — Other Ambulatory Visit: Payer: Managed Care, Other (non HMO)

## 2023-07-28 ENCOUNTER — Ambulatory Visit
Admission: RE | Admit: 2023-07-28 | Discharge: 2023-07-28 | Disposition: A | Discharge status: Home / Self Care | Payer: Managed Care, Other (non HMO) | Source: Ambulatory Visit | Attending: Orthopedic Surgery | Admitting: Orthopedic Surgery

## 2023-07-28 DIAGNOSIS — M542 Cervicalgia: Secondary | ICD-10-CM

## 2023-08-04 ENCOUNTER — Ambulatory Visit (INDEPENDENT_AMBULATORY_CARE_PROVIDER_SITE_OTHER): Payer: Managed Care, Other (non HMO) | Admitting: Orthopedic Surgery

## 2023-08-04 ENCOUNTER — Encounter: Payer: Self-pay | Admitting: Orthopedic Surgery

## 2023-08-04 DIAGNOSIS — M542 Cervicalgia: Secondary | ICD-10-CM | POA: Diagnosis not present

## 2023-08-04 NOTE — Progress Notes (Signed)
Office Visit Note   Patient: Aaron Soto           Date of Birth: [birth year]           MRN: [id number] Visit Date: 08/04/2023 Requested by: Richardean Chimera, MD [address] [place],  [place] [address] PCP: Richardean Chimera, MD  Subjective: Chief Complaint  Patient presents with   Spine - Follow-up    HPI: Aaron Soto is a 57 y.o. male who presents to the office reporting fairly severe left shoulder and arm pain.  Since he was last seen has had an MRI of the cervical spine.  I reviewed that scan myself and it shows at C4-5 C5-6 and C6-7 bulging disc which do touch the spinal cord.  There is some posterior space in the cord available.  On the left-hand side I think there is also some nerve root compression particularly at C5-6 and C6-7.  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  His pain is fairly constant and does improve with putting the arm up over his head.  Localizes pain primarily in the anterior part of his shoulder which runs down in a stripe down to his thumb..                ROS: All systems reviewed are negative as they relate to the chief complaint within the history of present illness.  Patient denies fevers or chills.  Assessment & Plan: Visit Diagnoses:  1. Neck pain     Plan: Impression is probable neck and shoulder pain which could be coming from 2 distinct pain generators in the neck and shoulder.  Plan at this time is to pursue this on parallel tracks.  Symptoms ongoing now for over a year.  I think it would be reasonable to deal with the known shoulder surgical pathology which would be the posterior labral tear and the spinoglenoid notch cyst.  That could be treated with superior labral debridement and biceps tenodesis.  Regarding the neck I think it would be important to try an epidural steroid injection in the neck to see what kind of relief he can get from this relatively constant pain he is having in his  whole arm.  Risk and benefits of surgery are discussed including not limited to infection nerve vessel damage incomplete pain relief as well as the expected rehabilitation which would essentially be a sling for 1 to 2 weeks followed by passive and active assisted range of motion of the shoulder for the next 6 weeks.  Patient understands risk and benefits and wishes to proceed.  Will post him for left shoulder surgery and also have him see Dr. Alvester Morin for diagnostic and therapeutic cervical spine ESI for left-sided radiculopathy.  Follow-Up Instructions: No follow-ups on file.   Orders:  Orders Placed This Encounter  Procedures   Ambulatory referral to Physical Medicine Rehab   No orders of the defined types were placed in this encounter.     Procedures: No procedures performed   Clinical Data: No additional findings.  Objective: Vital Signs: There were no vitals taken for this visit.  Physical Exam:  Constitutional: Patient appears well-developed HEENT:  Head: Normocephalic Eyes:EOM are normal Neck: Normal range of motion Cardiovascular: Normal rate Pulmonary/chest: Effort normal Neurologic: Patient is alert Skin: Skin is warm Psychiatric: Patient has normal mood and affect  Ortho Exam: Ortho exam demonstrates pretty reasonable cervical spine range of motion.  5 out of 5 grip EPL FPL interosseous resection extension bicep tricep deltoid strength.  Rotator cuff strength testing to external rotation is fairly symmetric left versus right.  Does have some biceps pain as well as positive O'Brien's testing on the left but not the right.  No discrete AC joint tenderness on the left-hand side.  Range of motion passively is about 45/90/160 but definitely more painful in the left than the right.  No definite paresthesias C5-T1 bilaterally.  No muscle atrophy left arm versus right.  Neck range of motion is also mildly tender.  Specialty Comments:  No specialty comments  available.  Imaging: No results found.   PMFS History: Patient Active Problem List   Diagnosis Date Noted   Lobar pneumonia (HCC)    Acute respiratory failure with hypoxia (HCC) 09/02/2020   Pneumonia due to COVID-19 virus 09/01/2020   Hypertension    Anxiety with depression    Hyponatremia    Protein calorie malnutrition (HCC)    Subarachnoid hemorrhage (HCC) 12/03/2018   Past Medical History:  Diagnosis Date   Anxiety    Depression    Hypertension     Family History  Problem Relation Age of Onset   Diabetes Mellitus II Father        B/L lower extremities amputation.    Past Surgical History:  Procedure Laterality Date   BACK SURGERY     CHOLECYSTECTOMY     HAND SURGERY     IR ANGIO EXTERNAL CAROTID SEL EXT CAROTID BILAT MOD SED  12/05/2018   IR ANGIO INTRA EXTRACRAN SEL INTERNAL CAROTID BILAT MOD SED  12/05/2018   IR ANGIO VERTEBRAL SEL VERTEBRAL UNI R MOD SED  12/05/2018   SPINAL CORD STIMULATOR IMPLANT     Social History   Occupational History   Not on file  Tobacco Use   Smoking status: Former    Current packs/day: 0.00    Types: Cigarettes    Quit date: 08/24/1986    Years since quitting: 36.9   Smokeless tobacco: Never  Substance and Sexual Activity   Alcohol use: No   Drug use: No   Sexual activity: Not on file

## 2023-08-19 ENCOUNTER — Telehealth: Payer: Self-pay | Admitting: Orthopedic Surgery

## 2023-08-19 ENCOUNTER — Other Ambulatory Visit: Payer: Self-pay | Admitting: Radiology

## 2023-08-19 MED ORDER — DIAZEPAM 5 MG PO TABS: ORAL_TABLET | ORAL | 0 refills | Status: DC

## 2023-08-19 NOTE — Telephone Encounter (Signed)
Pt called requesting a valium for tomorrow neck injection with Dr Alvester Morin. Pt states to need to keep him calm. Please send valium to [facility] and call pt when sent. Pt phone number is [phone].

## 2023-08-19 NOTE — Telephone Encounter (Signed)
Called in.

## 2023-08-19 NOTE — Telephone Encounter (Signed)
Ok for 10 mg po 30 min before  may repeat x 1 as needed # 4 thx

## 2023-08-20 ENCOUNTER — Encounter: Payer: Self-pay | Admitting: Physical Medicine and Rehabilitation

## 2023-08-20 ENCOUNTER — Other Ambulatory Visit: Payer: Self-pay

## 2023-08-20 ENCOUNTER — Ambulatory Visit (INDEPENDENT_AMBULATORY_CARE_PROVIDER_SITE_OTHER): Payer: Managed Care, Other (non HMO) | Admitting: Physical Medicine and Rehabilitation

## 2023-08-20 VITALS — BP 134/79 | HR 69

## 2023-08-20 DIAGNOSIS — M5412 Radiculopathy, cervical region: Secondary | ICD-10-CM

## 2023-08-20 MED ORDER — METHYLPREDNISOLONE ACETATE 40 MG/ML IJ SUSP
40.0000 mg | Freq: Once | INTRAMUSCULAR | Status: AC
  Administered 2023-08-20: 40 mg

## 2023-08-20 NOTE — Procedures (Signed)
Cervical Epidural Steroid Injection - Interlaminar Approach with Fluoroscopic Guidance  Patient: Aaron Soto      Date of Birth: [birth year] MRN: [id number] PCP: Richardean Chimera, MD      Visit Date: 08/20/2023   Universal Protocol:    Date/Time: 12/27/249:35 AM  Consent Given By: the patient  Position: PRONE  Additional Comments: Vital signs were monitored before and after the procedure. Patient was prepped and draped in the usual sterile fashion. The correct patient, procedure, and site was verified.   Injection Procedure Details:   Procedure diagnoses: Cervical radiculopathy [M54.12]    Meds Administered:  Meds ordered this encounter  Medications   methylPREDNISolone acetate (DEPO-MEDROL) injection 40 mg     Laterality: Left  Location/Site: C7-T1  Needle: 3.5 in., 20 ga. Tuohy  Needle Placement: Paramedian epidural space  Findings:  -Comments: Excellent flow of contrast into the epidural space.  Procedure Details: Using a paramedian approach from the side mentioned above, the region overlying the inferior lamina was localized under fluoroscopic visualization and the soft tissues overlying this structure were infiltrated with 4 ml. of 1% Lidocaine without Epinephrine. A # 20 gauge, Tuohy needle was inserted into the epidural space using a paramedian approach.  The epidural space was localized using loss of resistance along with contralateral oblique bi-planar fluoroscopic views.  After negative aspirate for air, blood, and CSF, a 2 ml. volume of Isovue-250 was injected into the epidural space and the flow of contrast was observed. Radiographs were obtained for documentation purposes.   The injectate was administered into the level noted above.  Additional Comments:  No complications occurred Dressing: 2 x 2 sterile gauze and Band-Aid    Post-procedure details: Patient was observed during the procedure. Post-procedure instructions were reviewed.  Patient left  the clinic in stable condition.

## 2023-08-20 NOTE — Patient Instructions (Signed)

## 2023-08-20 NOTE — Progress Notes (Signed)
Aaron Soto - 57 y.o. male MRN [id number]  Date of birth: 1966/02/07  Office Visit Note: Visit Date: 08/20/2023 PCP: Richardean Chimera, MD Referred by: Richardean Chimera, MD  Subjective: Chief Complaint  Patient presents with   Spine - Pain   HPI:  Aaron Soto is a 57 y.o. male who comes in today at the request of Dr. Burnard Bunting for planned Left C7-T1 Cervical Interlaminar epidural steroid injection with fluoroscopic guidance.  The patient has failed conservative care including home exercise, medications, time and activity modification.  This injection will be diagnostic and hopefully therapeutic.  Please see requesting physician notes for further details and justification.   ROS Otherwise per HPI.  Assessment & Plan: Visit Diagnoses:    ICD-10-CM   1. Cervical radiculopathy  M54.12 XR C-ARM NO REPORT    Epidural Steroid injection    methylPREDNISolone acetate (DEPO-MEDROL) injection 40 mg      Plan: No additional findings.   Meds & Orders:  Meds ordered this encounter  Medications   methylPREDNISolone acetate (DEPO-MEDROL) injection 40 mg    Orders Placed This Encounter  Procedures   XR C-ARM NO REPORT   Epidural Steroid injection    Follow-up: Return if symptoms worsen or fail to improve, for Burnard Bunting, MD 3 weeks.   Procedures: No procedures performed  Cervical Epidural Steroid Injection - Interlaminar Approach with Fluoroscopic Guidance  Patient: Aaron Soto      Date of Birth: [birth year] MRN: [id number] PCP: Richardean Chimera, MD      Visit Date: 08/20/2023   Universal Protocol:    Date/Time: 12/27/249:35 AM  Consent Given By: the patient  Position: PRONE  Additional Comments: Vital signs were monitored before and after the procedure. Patient was prepped and draped in the usual sterile fashion. The correct patient, procedure, and site was verified.   Injection Procedure Details:   Procedure diagnoses: Cervical radiculopathy [M54.12]    Meds  Administered:  Meds ordered this encounter  Medications   methylPREDNISolone acetate (DEPO-MEDROL) injection 40 mg     Laterality: Left  Location/Site: C7-T1  Needle: 3.5 in., 20 ga. Tuohy  Needle Placement: Paramedian epidural space  Findings:  -Comments: Excellent flow of contrast into the epidural space.  Procedure Details: Using a paramedian approach from the side mentioned above, the region overlying the inferior lamina was localized under fluoroscopic visualization and the soft tissues overlying this structure were infiltrated with 4 ml. of 1% Lidocaine without Epinephrine. A # 20 gauge, Tuohy needle was inserted into the epidural space using a paramedian approach.  The epidural space was localized using loss of resistance along with contralateral oblique bi-planar fluoroscopic views.  After negative aspirate for air, blood, and CSF, a 2 ml. volume of Isovue-250 was injected into the epidural space and the flow of contrast was observed. Radiographs were obtained for documentation purposes.   The injectate was administered into the level noted above.  Additional Comments:  No complications occurred Dressing: 2 x 2 sterile gauze and Band-Aid    Post-procedure details: Patient was observed during the procedure. Post-procedure instructions were reviewed.  Patient left the clinic in stable condition.   Clinical History: MRI CERVICAL SPINE WITHOUT CONTRAST   TECHNIQUE: Multiplanar, multisequence MR imaging of the cervical spine was performed. No intravenous contrast was administered.   COMPARISON:  Radiography 06/11/2023   FINDINGS: Alignment: Straightening of the normal cervical lordosis. No scoliosis or listhesis.   Vertebrae: No fracture or focal bone  lesion. No edematous endplate changes or edematous facet arthritis.   Cord: No cord compression or focal cord lesion.   Posterior Fossa, vertebral arteries, paraspinal tissues: Negative   Disc levels:    Foramen magnum, C1-2, C2-3 and C3-4 levels are normal.   C4-5: Mild bulging of the disc. Narrowing of the ventral subarachnoid space but no compressive effect upon the cord. No foraminal stenosis.   C5-6: Endplate osteophytes and mild bulging of the disc. Narrowing of the ventral subarachnoid space but no compression of the cord. Very mild foraminal narrowing on the left, not definitely compressive.   C6-7: Endplate osteophytes and mild bulging of the disc. Mild narrowing of the ventral subarachnoid space but no compressive effect upon the cord. Mild proximal foraminal narrowing on the left, not definitely compressive.   C7-T1: Normal interspace.   IMPRESSION: 1. No acute or traumatic finding. 2. Mild degenerative disc disease at C4-5, C5-6 and C6-7. Narrowing of the ventral subarachnoid space but no compressive effect upon the cord. Mild foraminal narrowing on the left at C5-6 and C6-7, not definitely compressive.     Electronically Signed   By: Paulina Fusi M.D.   On: 07/28/2023 17:27     Objective:  VS:  HT:    WT:   BMI:     BP:134/79  HR:69bpm  TEMP: ( )  RESP:  Physical Exam Vitals and nursing note reviewed.  Constitutional:      General: He is not in acute distress.    Appearance: Normal appearance. He is not ill-appearing.  HENT:     Head: Normocephalic and atraumatic.     Right Ear: External ear normal.     Left Ear: External ear normal.  Eyes:     Extraocular Movements: Extraocular movements intact.  Cardiovascular:     Rate and Rhythm: Normal rate.     Pulses: Normal pulses.  Abdominal:     General: There is no distension.     Palpations: Abdomen is soft.  Musculoskeletal:        General: No signs of injury.     Cervical back: Neck supple. Tenderness present. No rigidity.     Right lower leg: No edema.     Left lower leg: No edema.     Comments: Patient has good strength in the upper extremities with 5 out of 5 strength in wrist extension long  finger flexion APB.  No intrinsic hand muscle atrophy.  Negative Hoffmann's test.  Lymphadenopathy:     Cervical: No cervical adenopathy.  Skin:    Findings: No erythema or rash.  Neurological:     General: No focal deficit present.     Mental Status: He is alert and oriented to person, place, and time.     Sensory: No sensory deficit.     Motor: No weakness or abnormal muscle tone.     Coordination: Coordination normal.  Psychiatric:        Mood and Affect: Mood normal.        Behavior: Behavior normal.      Imaging: XR C-ARM NO REPORT Result Date: 08/20/2023 Please see Notes tab for imaging impression.

## 2023-08-20 NOTE — Progress Notes (Signed)
Functional Pain Scale - descriptive words and definitions  Unmanageable (7)  Pain interferes with normal ADL's/nothing seems to help/sleep is very difficult/active distractions are very difficult to concentrate on. Severe range order  Average Pain 7   +Driver, -BT, -Dye Allergies.  

## 2023-09-24 ENCOUNTER — Ambulatory Visit: Payer: Managed Care, Other (non HMO) | Admitting: Orthopedic Surgery

## 2023-10-11 ENCOUNTER — Encounter (INDEPENDENT_AMBULATORY_CARE_PROVIDER_SITE_OTHER): Payer: Self-pay | Admitting: *Deleted

## 2023-12-25 ENCOUNTER — Telehealth: Payer: Self-pay | Admitting: Orthopedic Surgery

## 2023-12-25 NOTE — Telephone Encounter (Signed)
 Patient would like to know the next course of action for the left shoulder/neck pain.  Had injection from Dr. Daisey Dryer 08/20/23 and it provided relief for about 2 months but the discomfort is back.  Patient states he had called to provide feedback about the injection with Dr. Daisey Dryer to let them know if it helped or not, but has not heard back from anyone.  Current surgery order for left shoulder arthroscopy, labral debridement, biceps tenodesis is from 08/04/23   Please contact patient at [phone] to advise.

## 2023-12-27 ENCOUNTER — Encounter (INDEPENDENT_AMBULATORY_CARE_PROVIDER_SITE_OTHER): Payer: Self-pay | Admitting: *Deleted

## 2023-12-29 ENCOUNTER — Other Ambulatory Visit: Payer: Self-pay | Admitting: Physical Medicine and Rehabilitation

## 2023-12-29 DIAGNOSIS — M5412 Radiculopathy, cervical region: Secondary | ICD-10-CM

## 2024-01-25 ENCOUNTER — Ambulatory Visit (INDEPENDENT_AMBULATORY_CARE_PROVIDER_SITE_OTHER): Admitting: Physical Medicine and Rehabilitation

## 2024-01-25 ENCOUNTER — Other Ambulatory Visit: Payer: Self-pay

## 2024-01-25 VITALS — BP 143/85 | HR 90

## 2024-01-25 DIAGNOSIS — M5412 Radiculopathy, cervical region: Secondary | ICD-10-CM

## 2024-01-25 MED ORDER — METHYLPREDNISOLONE ACETATE 40 MG/ML IJ SUSP
40.0000 mg | Freq: Once | INTRAMUSCULAR | Status: AC
  Administered 2024-01-25: 40 mg

## 2024-01-25 NOTE — Progress Notes (Unsigned)
 KACIN DANCY - 58 y.o. male MRN [id number]  Date of birth: 01-Apr-1966  Office Visit Note: Visit Date: 01/25/2024 PCP: Leesa Pulling, MD Referred by: Leesa Pulling, MD  Subjective: Chief Complaint  Patient presents with   Neck - Pain   HPI:  DONNOVAN STAMOUR is a 58 y.o. male who comes in today for planned repeat Left C7-T1  Cervical Interlaminar epidural steroid injection with fluoroscopic guidance.  The patient has failed conservative care including home exercise, medications, time and activity modification.  This injection will be diagnostic and hopefully therapeutic.  Please see requesting physician notes for further details and justification. Patient received more than 50% pain relief from prior injection.   Referring: Dr. Clerance Dais Dean   ROS Otherwise per HPI.  Assessment & Plan: Visit Diagnoses:    ICD-10-CM   1. Cervical radiculopathy  M54.12 XR C-ARM NO REPORT    Epidural Steroid injection    methylPREDNISolone  acetate (DEPO-MEDROL ) injection 40 mg      Plan: No additional findings.   Meds & Orders:  Meds ordered this encounter  Medications   methylPREDNISolone  acetate (DEPO-MEDROL ) injection 40 mg    Orders Placed This Encounter  Procedures   XR C-ARM NO REPORT   Epidural Steroid injection    Follow-up: Return if symptoms worsen or fail to improve.   Procedures: No procedures performed  Cervical Epidural Steroid Injection - Interlaminar Approach with Fluoroscopic Guidance  Patient: KLINT LEZCANO      Date of Birth: [birth year] MRN: [id number] PCP: Leesa Pulling, MD      Visit Date: 01/25/2024   Universal Protocol:    Date/Time: 06/03/253:38 PM  Consent Given By: the patient  Position: PRONE  Additional Comments: Vital signs were monitored before and after the procedure. Patient was prepped and draped in the usual sterile fashion. The correct patient, procedure, and site was verified.   Injection Procedure Details:   Procedure diagnoses:  Cervical radiculopathy [M54.12]    Meds Administered:  Meds ordered this encounter  Medications   methylPREDNISolone  acetate (DEPO-MEDROL ) injection 40 mg     Laterality: Left  Location/Site: C7-T1  Needle: 3.5 in., 20 ga. Tuohy  Needle Placement: Paramedian epidural space  Findings:  -Comments: Excellent flow of contrast into the epidural space.  Procedure Details: Using a paramedian approach from the side mentioned above, the region overlying the inferior lamina was localized under fluoroscopic visualization and the soft tissues overlying this structure were infiltrated with 4 ml. of 1% Lidocaine  without Epinephrine. A # 20 gauge, Tuohy needle was inserted into the epidural space using a paramedian approach.  The epidural space was localized using loss of resistance along with contralateral oblique bi-planar fluoroscopic views.  After negative aspirate for air, blood, and CSF, a 2 ml. volume of Isovue-250 was injected into the epidural space and the flow of contrast was observed. Radiographs were obtained for documentation purposes.   The injectate was administered into the level noted above.  Additional Comments:  The patient tolerated the procedure well Dressing: 2 x 2 sterile gauze and Band-Aid    Post-procedure details: Patient was observed during the procedure. Post-procedure instructions were reviewed.  Patient left the clinic in stable condition.   Clinical History: MRI CERVICAL SPINE WITHOUT CONTRAST   TECHNIQUE: Multiplanar, multisequence MR imaging of the cervical spine was performed. No intravenous contrast was administered.   COMPARISON:  Radiography 06/11/2023   FINDINGS: Alignment: Straightening of the normal cervical lordosis. No scoliosis or listhesis.  Vertebrae: No fracture or focal bone lesion. No edematous endplate changes or edematous facet arthritis.   Cord: No cord compression or focal cord lesion.   Posterior Fossa, vertebral arteries,  paraspinal tissues: Negative   Disc levels:   Foramen magnum, C1-2, C2-3 and C3-4 levels are normal.   C4-5: Mild bulging of the disc. Narrowing of the ventral subarachnoid space but no compressive effect upon the cord. No foraminal stenosis.   C5-6: Endplate osteophytes and mild bulging of the disc. Narrowing of the ventral subarachnoid space but no compression of the cord. Very mild foraminal narrowing on the left, not definitely compressive.   C6-7: Endplate osteophytes and mild bulging of the disc. Mild narrowing of the ventral subarachnoid space but no compressive effect upon the cord. Mild proximal foraminal narrowing on the left, not definitely compressive.   C7-T1: Normal interspace.   IMPRESSION: 1. No acute or traumatic finding. 2. Mild degenerative disc disease at C4-5, C5-6 and C6-7. Narrowing of the ventral subarachnoid space but no compressive effect upon the cord. Mild foraminal narrowing on the left at C5-6 and C6-7, not definitely compressive.     Electronically Signed   By: Bettylou Brunner M.D.   On: 07/28/2023 17:27     Objective:  VS:  HT:    WT:   BMI:     BP:   HR: bpm  TEMP: ( )  RESP:  Physical Exam Vitals and nursing note reviewed.  Constitutional:      General: He is not in acute distress.    Appearance: Normal appearance. He is not ill-appearing.  HENT:     Head: Normocephalic and atraumatic.     Right Ear: External ear normal.     Left Ear: External ear normal.  Eyes:     Extraocular Movements: Extraocular movements intact.  Cardiovascular:     Rate and Rhythm: Normal rate.     Pulses: Normal pulses.  Abdominal:     General: There is no distension.     Palpations: Abdomen is soft.  Musculoskeletal:        General: No signs of injury.     Cervical back: Neck supple. Tenderness present. No rigidity.     Right lower leg: No edema.     Left lower leg: No edema.     Comments: Patient has good strength in the upper extremities with 5  out of 5 strength in wrist extension long finger flexion APB.  No intrinsic hand muscle atrophy.  Negative Hoffmann's test.  Lymphadenopathy:     Cervical: No cervical adenopathy.  Skin:    Findings: No erythema or rash.  Neurological:     General: No focal deficit present.     Mental Status: He is alert and oriented to person, place, and time.     Sensory: No sensory deficit.     Motor: No weakness or abnormal muscle tone.     Coordination: Coordination normal.  Psychiatric:        Mood and Affect: Mood normal.        Behavior: Behavior normal.      Imaging: No results found.

## 2024-01-25 NOTE — Progress Notes (Unsigned)
 Pain Scale   Average Pain 5 Patient advising he has chronic neck pain radiating to left and right shoulders at times        +Driver, -BT, -Dye Allergies.

## 2024-01-25 NOTE — Procedures (Unsigned)
 Cervical Epidural Steroid Injection - Interlaminar Approach with Fluoroscopic Guidance  Patient: Aaron Soto      Date of Birth: 30-Mar-1966 MRN: [id number] PCP: Leesa Pulling, MD      Visit Date: 01/25/2024   Universal Protocol:    Date/Time: 06/03/253:38 PM  Consent Given By: the patient  Position: PRONE  Additional Comments: Vital signs were monitored before and after the procedure. Patient was prepped and draped in the usual sterile fashion. The correct patient, procedure, and site was verified.   Injection Procedure Details:   Procedure diagnoses: Cervical radiculopathy [M54.12]    Meds Administered:  Meds ordered this encounter  Medications   methylPREDNISolone  acetate (DEPO-MEDROL ) injection 40 mg     Laterality: Left  Location/Site: C7-T1  Needle: 3.5 in., 20 ga. Tuohy  Needle Placement: Paramedian epidural space  Findings:  -Comments: Excellent flow of contrast into the epidural space.  Procedure Details: Using a paramedian approach from the side mentioned above, the region overlying the inferior lamina was localized under fluoroscopic visualization and the soft tissues overlying this structure were infiltrated with 4 ml. of 1% Lidocaine  without Epinephrine. A # 20 gauge, Tuohy needle was inserted into the epidural space using a paramedian approach.  The epidural space was localized using loss of resistance along with contralateral oblique bi-planar fluoroscopic views.  After negative aspirate for air, blood, and CSF, a 2 ml. volume of Isovue-250 was injected into the epidural space and the flow of contrast was observed. Radiographs were obtained for documentation purposes.   The injectate was administered into the level noted above.  Additional Comments:  The patient tolerated the procedure well Dressing: 2 x 2 sterile gauze and Band-Aid    Post-procedure details: Patient was observed during the procedure. Post-procedure instructions were  reviewed.  Patient left the clinic in stable condition.

## 2024-01-25 NOTE — Patient Instructions (Signed)

## 2024-02-04 ENCOUNTER — Telehealth: Payer: Self-pay

## 2024-02-04 NOTE — Telephone Encounter (Signed)
 Who is your primary care physician: Aaron Soto  Reasons for the colonoscopy: HX POLYPS  Have you had a colonoscopy before?  YES  Do you have family history of colon cancer? NO  Previous colonoscopy with polyps removed? YES  Do you have a history colorectal cancer?   NO  Are you diabetic? If yes, Type 1 or Type 2?    NO  Do you have a prosthetic or mechanical heart valve? NO  Do you have a pacemaker/defibrillator?   NO  Have you had endocarditis/atrial fibrillation? NO  Have you had joint replacement within the last 12 months?  NO  Do you tend to be constipated or have to use laxatives? NO  Do you have any history of drugs or alchohol?  NO  Do you use supplemental oxygen?  NO  Have you had a stroke or heart attack within the last 6 months? NO  Do you take weight loss medication?  NO    Do you take any blood-thinning medications such as: (aspirin, warfarin, Plavix, Aggrenox)  NO  If yes we need the name, milligram, dosage and who is prescribing doctor  Current Outpatient Medications on File Prior to Visit  Medication Sig Dispense Refill   ascorbic acid  (VITAMIN C) 500 MG tablet Take 1 tablet (500 mg total) by mouth daily. 30 tablet 1   atorvastatin (LIPITOR) 40 MG tablet Take 40 mg by mouth daily.     benzonatate (TESSALON) 100 MG capsule Take 100 mg by mouth 3 (three) times daily.     busPIRone  (BUSPAR ) 10 MG tablet Take 20 mg by mouth 3 (three) times daily.      celecoxib (CELEBREX) 200 MG capsule Take 200 mg by mouth daily.     cyclobenzaprine  (FLEXERIL ) 10 MG tablet Take 10 mg by mouth daily.      dexamethasone  (DECADRON ) 4 MG tablet Take 4 mg by mouth 2 (two) times daily with a meal.     diazepam  (VALIUM ) 5 MG tablet Take 1 - 2 tablets (10mg ) po 30 minutes before, may repeat x 1 as needed. 4 tablet 0   DULoxetine  (CYMBALTA ) 60 MG capsule Take 60 mg by mouth 2 (two) times daily.     guaiFENesin -dextromethorphan (ROBITUSSIN DM) 100-10 MG/5ML syrup Take 5 mLs by mouth  every 4 (four) hours as needed for cough. 118 mL 0   imipramine (TOFRANIL) 10 MG tablet Take 30 mg by mouth at bedtime.     Ipratropium-Albuterol  (COMBIVENT) 20-100 MCG/ACT AERS respimat Inhale 1 puff into the lungs every 6 (six) hours as needed for wheezing. 4 g 1   lisinopril  (PRINIVIL ,ZESTRIL ) 10 MG tablet Take 10 mg by mouth daily.      Multiple Vitamin (MULTIVITAMIN) tablet Take 1 tablet by mouth daily.     omeprazole  (PRILOSEC) 20 MG capsule Take 2 capsules (40 mg total) by mouth daily. 60 capsule 1   vitamin B-12 (CYANOCOBALAMIN ) 500 MCG tablet Take 500 mcg by mouth daily.     zinc  sulfate 220 (50 Zn) MG capsule Take 1 capsule (220 mg total) by mouth daily. 30 capsule 1   No current facility-administered medications on file prior to visit.    No Known Allergies   Pharmacy: [facility]  Primary Insurance Name: NOT LISTED  Best number where you can be reached: [phone]

## 2024-03-28 NOTE — Telephone Encounter (Signed)
 We need to know when last colonoscopy was completed. Need report and path as well.

## 2024-05-29 ENCOUNTER — Encounter (INDEPENDENT_AMBULATORY_CARE_PROVIDER_SITE_OTHER): Payer: Self-pay | Admitting: *Deleted

## 2024-05-29 NOTE — Telephone Encounter (Signed)
 Per Jenkins, this referral was for main [facility]. Copy of report is in media tab.

## 2024-05-30 NOTE — Telephone Encounter (Signed)
 Ok to schedule.  Room any room   Thanks,  Korine Winton Faizan Royce Stegman, MD Gastroenterology and Hepatology [facility] Gastroenterology

## 2024-05-31 MED ORDER — PEG 3350-KCL-NA BICARB-NACL 420 G PO SOLR: 4000.0000 mL | Freq: Once | ORAL | 0 refills | Status: AC

## 2024-05-31 NOTE — Telephone Encounter (Signed)
 Spoke with patient, scheduled TCS for 06/29/2024 at 7:30 am. Rx sent to pharmacy. Instructions sent through the mail.

## 2024-05-31 NOTE — Addendum Note (Signed)
 Addended by: DALLIE LIONEL RAMAN on: 05/31/2024 01:53 PM   Modules accepted: Orders

## 2024-06-01 NOTE — Telephone Encounter (Signed)
 Spoke with patient, informed him of pre-op telephone call appt. Patient verbalized understanding.

## 2024-06-05 NOTE — Telephone Encounter (Signed)
 Patient called needing to reschedule his procedure due to a work trip being scheduled that interfered with his procedure date. Rescheduled to 07/07/2024 at 7:30am. Sending patient updated instructions and message sent to ENDO to reschedule procedure date.

## 2024-06-23 ENCOUNTER — Other Ambulatory Visit (HOSPITAL_COMMUNITY)

## 2024-06-26 ENCOUNTER — Encounter: Payer: Self-pay | Admitting: Radiology

## 2024-06-28 NOTE — Telephone Encounter (Signed)
 Pt called in and needed to reschedule his procedure. He has been moved to 12/15. Aware will mail new instructions. Message also sen to endo making aware.

## 2024-07-04 ENCOUNTER — Encounter (HOSPITAL_COMMUNITY)

## 2024-08-01 ENCOUNTER — Other Ambulatory Visit (HOSPITAL_COMMUNITY): Payer: Self-pay

## 2024-08-07 ENCOUNTER — Encounter (HOSPITAL_COMMUNITY): Admission: RE | Disposition: A | Payer: Self-pay | Source: Home / Self Care | Attending: Gastroenterology

## 2024-08-07 ENCOUNTER — Encounter (HOSPITAL_COMMUNITY): Payer: Self-pay | Admitting: Gastroenterology

## 2024-08-07 ENCOUNTER — Ambulatory Visit (HOSPITAL_COMMUNITY)
Admission: RE | Admit: 2024-08-07 | Discharge: 2024-08-07 | Disposition: A | Attending: Gastroenterology | Admitting: Gastroenterology

## 2024-08-07 ENCOUNTER — Other Ambulatory Visit: Payer: Self-pay

## 2024-08-07 ENCOUNTER — Ambulatory Visit (HOSPITAL_COMMUNITY): Admitting: Certified Registered"

## 2024-08-07 DIAGNOSIS — K5730 Diverticulosis of large intestine without perforation or abscess without bleeding: Secondary | ICD-10-CM | POA: Diagnosis not present

## 2024-08-07 DIAGNOSIS — K635 Polyp of colon: Secondary | ICD-10-CM | POA: Insufficient documentation

## 2024-08-07 DIAGNOSIS — Z1211 Encounter for screening for malignant neoplasm of colon: Secondary | ICD-10-CM

## 2024-08-07 DIAGNOSIS — D125 Benign neoplasm of sigmoid colon: Secondary | ICD-10-CM

## 2024-08-07 DIAGNOSIS — Z87891 Personal history of nicotine dependence: Secondary | ICD-10-CM | POA: Insufficient documentation

## 2024-08-07 DIAGNOSIS — K648 Other hemorrhoids: Secondary | ICD-10-CM | POA: Diagnosis not present

## 2024-08-07 DIAGNOSIS — F418 Other specified anxiety disorders: Secondary | ICD-10-CM | POA: Diagnosis not present

## 2024-08-07 DIAGNOSIS — I10 Essential (primary) hypertension: Secondary | ICD-10-CM | POA: Diagnosis not present

## 2024-08-07 HISTORY — PX: POLYPECTOMY: SHX149

## 2024-08-07 HISTORY — PX: COLONOSCOPY: SHX5424

## 2024-08-07 LAB — HM COLONOSCOPY

## 2024-08-07 SURGERY — COLONOSCOPY
Anesthesia: Monitor Anesthesia Care

## 2024-08-07 MED ORDER — PROPOFOL 500 MG/50ML IV EMUL
INTRAVENOUS | Status: DC | PRN
  Administered 2024-08-07: 08:00:00 60 mg via INTRAVENOUS
  Administered 2024-08-07: 08:00:00 20 mg via INTRAVENOUS
  Administered 2024-08-07: 08:00:00 100 ug/kg/min via INTRAVENOUS
  Administered 2024-08-07: 08:00:00 20 mg via INTRAVENOUS

## 2024-08-07 MED ORDER — LIDOCAINE 2% (20 MG/ML) 5 ML SYRINGE
INTRAMUSCULAR | Status: DC | PRN
  Administered 2024-08-07: 08:00:00 50 mg via INTRAVENOUS

## 2024-08-07 MED ORDER — LACTATED RINGERS IV SOLN: INTRAVENOUS | Status: DC | PRN

## 2024-08-07 MED ORDER — LACTATED RINGERS IV SOLN: INTRAVENOUS | Status: DC

## 2024-08-07 NOTE — Anesthesia Preprocedure Evaluation (Signed)
 Anesthesia Evaluation  Patient identified by MRN, date of birth, ID band Patient awake    Reviewed: Allergy & Precautions, H&P , NPO status , Patient's Chart, lab work & pertinent test results, reviewed documented beta blocker date and time   Airway Mallampati: II  TM Distance: >3 FB Neck ROM: full    Dental no notable dental hx.    Pulmonary neg pulmonary ROS, pneumonia, former smoker   Pulmonary exam normal breath sounds clear to auscultation       Cardiovascular Exercise Tolerance: Good hypertension, negative cardio ROS  Rhythm:regular Rate:Normal     Neuro/Psych  PSYCHIATRIC DISORDERS Anxiety Depression    negative neurological ROS  negative psych ROS   GI/Hepatic negative GI ROS, Neg liver ROS,,,  Endo/Other  negative endocrine ROS    Renal/GU negative Renal ROS  negative genitourinary   Musculoskeletal   Abdominal   Peds  Hematology negative hematology ROS (+)   Anesthesia Other Findings   Reproductive/Obstetrics negative OB ROS                              Anesthesia Physical Anesthesia Plan  ASA: 2  Anesthesia Plan: MAC   Post-op Pain Management:    Induction:   PONV Risk Score and Plan: Propofol  infusion  Airway Management Planned:   Additional Equipment:   Intra-op Plan:   Post-operative Plan:   Informed Consent: I have reviewed the patients History and Physical, chart, labs and discussed the procedure including the risks, benefits and alternatives for the proposed anesthesia with the patient or authorized representative who has indicated his/her understanding and acceptance.     Dental Advisory Given  Plan Discussed with: CRNA  Anesthesia Plan Comments:         Anesthesia Quick Evaluation

## 2024-08-07 NOTE — Discharge Instructions (Addendum)

## 2024-08-07 NOTE — Op Note (Signed)
 [facility] Patient Name: Aaron Soto Procedure Date: 08/07/2024 8:01 AM MRN: [id number] Date of Birth: [birth year] Attending MD: Deatrice Dine , MD, [id number] CSN: [id number] Age: 58 Admit Type: Outpatient Procedure:                Colonoscopy Indications:              Screening for colorectal malignant neoplasm Providers:                Deatrice Dine, MD, Madelin Hunter, RN, Bascom Blush Referring MD:              Medicines:                Monitored Anesthesia Care Complications:            No immediate complications. Estimated Blood Loss:     Estimated blood loss was minimal. Procedure:                Pre-Anesthesia Assessment:                           - Prior to the procedure, a History and Physical                            was performed, and patient medications and                            allergies were reviewed. The patient's tolerance of                            previous anesthesia was also reviewed. The risks                            and benefits of the procedure and the sedation                            options and risks were discussed with the patient.                            All questions were answered, and informed consent                            was obtained. Prior Anticoagulants: The patient has                            taken no anticoagulant or antiplatelet agents                            except for aspirin. ASA Grade Assessment: II - A                            patient with mild systemic disease. After reviewing                            the risks and benefits, the patient was deemed in  satisfactory condition to undergo the procedure.                           After obtaining informed consent, the colonoscope                            was passed under direct vision. Throughout the                            procedure, the patient's blood pressure, pulse, and                            oxygen saturations were  monitored continuously. The                            [id number]-[id number] ([id number]) Colon was introduced through                            the anus and advanced to the the cecum, identified                            by appendiceal orifice and ileocecal valve. The                            colonoscopy was performed without difficulty. The                            patient tolerated the procedure well. The quality                            of the bowel preparation was evaluated using the                            BBPS [facility] Bowel Preparation Scale) with scores                            of: Right Colon = 3, Transverse Colon = 3 and Left                            Colon = 3 (entire mucosa seen well with no residual                            staining, small fragments of stool or opaque                            liquid). The total BBPS score equals 9. The                            ileocecal valve, appendiceal orifice, and rectum                            were photographed. Scope In: 8:17:24 AM Scope Out: 8:36:12 AM Scope Withdrawal Time: 0 hours 16 minutes  3 seconds  Total Procedure Duration: 0 hours 18 minutes 48 seconds  Findings:      The perianal and digital rectal examinations were normal.      Two sessile polyps were found in the sigmoid colon. The polyps were 3 to       5 mm in size. These polyps were removed with a cold snare. Resection and       retrieval were complete.      A few small-mouthed diverticula were found in the left colon.      Internal hemorrhoids were found during retroflexion. The hemorrhoids       were small. Impression:               - Two 3 to 5 mm polyps in the sigmoid colon,                            removed with a cold snare. Resected and retrieved.                           - Diverticulosis in the left colon.                           - Internal hemorrhoids. Moderate Sedation:      Per Anesthesia Care Recommendation:           - Patient has a contact  number available for                            emergencies. The signs and symptoms of potential                            delayed complications were discussed with the                            patient. Return to normal activities tomorrow.                            Written discharge instructions were provided to the                            patient.                           - Resume previous diet.                           - Continue present medications.                           - Await pathology results.                           - Repeat colonoscopy in 7-10 years for surveillance                            based on pathology results.                           -  Return to primary care physician. Procedure Code(s):        --- Professional ---                           [id number], Colonoscopy, flexible; with removal of                            tumor(s), polyp(s), or other lesion(s) by snare                            technique Diagnosis Code(s):        --- Professional ---                           Z12.11, Encounter for screening for malignant                            neoplasm of colon                           D12.5, Benign neoplasm of sigmoid colon                           K64.8, Other hemorrhoids                           K57.30, Diverticulosis of large intestine without                            perforation or abscess without bleeding CPT copyright 2022 [employer]. All rights reserved. The codes documented in this report are preliminary and upon coder review may  be revised to meet current compliance requirements. Deatrice Dine, MD Deatrice Dine, MD 08/07/2024 8:40:25 AM This report has been signed electronically. Number of Addenda: 0

## 2024-08-07 NOTE — H&P (Signed)
 Primary Care Physician:  Toribio Jerel MATSU, MD Primary Gastroenterologist:  Dr. Cinderella  Pre-Procedure History & Physical: HPI:  Aaron Soto is a 58 y.o. male is here for a colonoscopy for colon cancer screening purposes.  Patient denies any family history of colorectal cancer.  No melena or hematochezia.  No abdominal pain or unintentional weight loss.  No change in bowel habits.  Overall feels well from a GI standpoint.  Past Medical History:  Diagnosis Date   Anxiety    Depression    Hypertension     Past Surgical History:  Procedure Laterality Date   BACK SURGERY     CHOLECYSTECTOMY     HAND SURGERY     IR ANGIO EXTERNAL CAROTID SEL EXT CAROTID BILAT MOD SED  12/05/2018   IR ANGIO INTRA EXTRACRAN SEL INTERNAL CAROTID BILAT MOD SED  12/05/2018   IR ANGIO VERTEBRAL SEL VERTEBRAL UNI R MOD SED  12/05/2018   SPINAL CORD STIMULATOR IMPLANT      Prior to Admission medications  Medication Sig Start Date End Date Taking? Authorizing Provider  atorvastatin (LIPITOR) 40 MG tablet Take 40 mg by mouth daily.   Yes [provider]  busPIRone  (BUSPAR ) 10 MG tablet Take 20 mg by mouth 3 (three) times daily.    Yes [provider]  DULoxetine  (CYMBALTA ) 60 MG capsule Take 60 mg by mouth 2 (two) times daily.   Yes [provider]  lisinopril  (PRINIVIL ,ZESTRIL ) 10 MG tablet Take 10 mg by mouth daily.    Yes [provider]  omeprazole  (PRILOSEC) 20 MG capsule Take 2 capsules (40 mg total) by mouth daily. 09/05/20  Yes Ricky Fines, MD    Allergies as of 05/31/2024 - Review Complete 02/04/2024  Allergen Reaction Noted   Griseofulvin ultramicrosize [griseofulvin]  02/04/2024    Family History  Problem Relation Age of Onset   Diabetes Mellitus II Father        B/L lower extremities amputation.    Social History   Socioeconomic History   Marital status: Married    Spouse name: Not on file   Number of children: Not on file   Years of education: Not on  file   Highest education level: Not on file  Occupational History   Not on file  Tobacco Use   Smoking status: Former    Current packs/day: 0.00    Types: Cigarettes    Quit date: 08/24/1986    Years since quitting: 37.9   Smokeless tobacco: Never  Vaping Use   Vaping status: Never Used  Substance and Sexual Activity   Alcohol use: No   Drug use: No   Sexual activity: Not on file  Other Topics Concern   Not on file  Social History Narrative   Not on file   Social Drivers of Health   Tobacco Use: Medium Risk (08/07/2024)   Patient History    Smoking Tobacco Use: Former    Smokeless Tobacco Use: Never    Passive Exposure: Not on Actuary Strain: Not on file  Food Insecurity: Not on file  Transportation Needs: Not on file  Physical Activity: Not on file  Stress: Not on file  Social Connections: Unknown (02/25/2022)   Received from [facility]   Social Network    Social Network: Not on file  Intimate Partner Violence: Unknown (02/25/2022)   Received from [facility]   HITS    Physically Hurt: Not on file    Insult or Talk Down  To: Not on file    Threaten Physical Harm: Not on file    Scream or Curse: Not on file  Depression (PHQ2-9): Not on file  Alcohol Screen: Not on file  Housing: Not on file  Utilities: Not on file  Health Literacy: Not on file    Review of Systems: See HPI, otherwise negative ROS  Physical Exam: Vital signs in last 24 hours: Temp:  [97.6 F (36.4 C)] 97.6 F (36.4 C) (12/15 0843) Pulse Rate:  [80-91] 80 (12/15 0843) Resp:  [19] 19 (12/15 0843) BP: (117)/(70) 117/70 (12/15 0843) SpO2:  [97 %-99 %] 99 % (12/15 0843) Weight:  [120.2 kg] 120.2 kg (12/15 0720)   General:   Alert,  Well-developed, well-nourished, pleasant and cooperative in NAD Head:  Normocephalic and atraumatic. Eyes:  Sclera clear, no icterus.   Conjunctiva pink. Ears:  Normal auditory acuity. Nose:  No deformity, discharge,  or lesions. Msk:   Symmetrical without gross deformities. Normal posture. Extremities:  Without clubbing or edema. Neurologic:  Alert and  oriented x4;  grossly normal neurologically. Skin:  Intact without significant lesions or rashes. Psych:  Alert and cooperative. Normal mood and affect.  Impression/Plan: Aaron Soto is here for a colonoscopy to be performed for colon cancer screening purposes.  The risks of the procedure including infection, bleed, or perforation as well as benefits, limitations, alternatives and imponderables have been reviewed with the patient. Questions have been answered. All parties agreeable.

## 2024-08-07 NOTE — Transfer of Care (Signed)
 Immediate Anesthesia Transfer of Care Note  Patient: Aaron Soto  Procedure(s) Performed: COLONOSCOPY POLYPECTOMY, INTESTINE  Patient Location: Endoscopy Unit  Anesthesia Type:General  Level of Consciousness: awake, alert , and oriented  Airway & Oxygen Therapy: Patient Spontanous Breathing  Post-op Assessment: Report given to RN and Post -op Vital signs reviewed and stable  Post vital signs: Reviewed and stable  Last Vitals:  Vitals Value Taken Time  BP 117/70 08/07/24 08:43  Temp 36.4 C 08/07/24 08:43  Pulse 80 08/07/24 08:43  Resp 19 08/07/24 08:43  SpO2 99 % 08/07/24 08:43    Last Pain:  Vitals:   08/07/24 0843  TempSrc: Oral  PainSc: 0-No pain      Patients Stated Pain Goal: 7 (08/07/24 0720)  Complications: No notable events documented.

## 2024-08-08 ENCOUNTER — Encounter (INDEPENDENT_AMBULATORY_CARE_PROVIDER_SITE_OTHER): Payer: Self-pay | Admitting: *Deleted

## 2024-08-08 ENCOUNTER — Encounter (HOSPITAL_COMMUNITY): Payer: Self-pay | Admitting: Gastroenterology

## 2024-08-08 LAB — SURGICAL PATHOLOGY

## 2024-08-11 ENCOUNTER — Ambulatory Visit (INDEPENDENT_AMBULATORY_CARE_PROVIDER_SITE_OTHER): Payer: Self-pay | Admitting: Gastroenterology

## 2024-08-11 NOTE — Anesthesia Postprocedure Evaluation (Signed)
"   Anesthesia Post Note  Patient: Aaron Soto  Procedure(s) Performed: COLONOSCOPY POLYPECTOMY, INTESTINE  Patient location during evaluation: Phase II Anesthesia Type: MAC Level of consciousness: awake Pain management: pain level controlled Vital Signs Assessment: post-procedure vital signs reviewed and stable Respiratory status: spontaneous breathing and respiratory function stable Cardiovascular status: blood pressure returned to baseline and stable Postop Assessment: no headache and no apparent nausea or vomiting Anesthetic complications: no Comments: Late entry   No notable events documented.   Last Vitals:  Vitals:   08/07/24 0720 08/07/24 0843  BP:  117/70  Pulse: 91 80  Resp: 19 19  Temp: 36.4 C 36.4 C  SpO2: 97% 99%    Last Pain:  Vitals:   08/07/24 0843  TempSrc: Oral  PainSc: 0-No pain                 Yvonna PARAS Anamika Kueker      "

## 2024-08-14 NOTE — Progress Notes (Signed)
 10 yr TCS noted in recall Patient result letter mailed procedure note and pathology result faxed to PCP
